# Patient Record
Sex: Male | Born: 1995 | Race: White | Hispanic: No | Marital: Single | Smoking: Current every day smoker
Health system: Southern US, Community
[De-identification: ages and names within clinical notes are randomized; demographics above are authoritative.]

## PROBLEM LIST (undated history)

## (undated) DIAGNOSIS — F419 Anxiety disorder, unspecified: Secondary | ICD-10-CM

## (undated) DIAGNOSIS — K219 Gastro-esophageal reflux disease without esophagitis: Secondary | ICD-10-CM

## (undated) DIAGNOSIS — F4310 Post-traumatic stress disorder, unspecified: Secondary | ICD-10-CM

## (undated) DIAGNOSIS — F32A Depression, unspecified: Secondary | ICD-10-CM

## (undated) HISTORY — DX: Gastro-esophageal reflux disease without esophagitis: K21.9

## (undated) HISTORY — DX: Anxiety disorder, unspecified: F41.9

## (undated) HISTORY — DX: Post-traumatic stress disorder, unspecified: F43.10

## (undated) HISTORY — DX: Depression, unspecified: F32.A

---

## 2016-08-07 ENCOUNTER — Emergency Department (HOSPITAL_COMMUNITY)
Admission: EM | Admit: 2016-08-07 | Discharge: 2016-08-07 | Disposition: A | Discharge status: Home / Self Care | Payer: Self-pay | Attending: Emergency Medicine | Admitting: Emergency Medicine

## 2016-08-07 ENCOUNTER — Encounter (HOSPITAL_COMMUNITY): Payer: Self-pay

## 2016-08-07 ENCOUNTER — Emergency Department (HOSPITAL_COMMUNITY): Payer: Self-pay

## 2016-08-07 DIAGNOSIS — R1084 Generalized abdominal pain: Secondary | ICD-10-CM | POA: Insufficient documentation

## 2016-08-07 DIAGNOSIS — Z79899 Other long term (current) drug therapy: Secondary | ICD-10-CM | POA: Insufficient documentation

## 2016-08-07 DIAGNOSIS — F17210 Nicotine dependence, cigarettes, uncomplicated: Secondary | ICD-10-CM | POA: Insufficient documentation

## 2016-08-07 DIAGNOSIS — F1290 Cannabis use, unspecified, uncomplicated: Secondary | ICD-10-CM | POA: Insufficient documentation

## 2016-08-07 DIAGNOSIS — R109 Unspecified abdominal pain: Secondary | ICD-10-CM

## 2016-08-07 LAB — COMPREHENSIVE METABOLIC PANEL
ALBUMIN: 4.4 g/dL (ref 3.5–5.0)
ALK PHOS: 91 U/L (ref 38–126)
ALT: 22 U/L (ref 17–63)
AST: 19 U/L (ref 15–41)
Anion gap: 5 (ref 5–15)
BILIRUBIN TOTAL: 0.8 mg/dL (ref 0.3–1.2)
BUN: 11 mg/dL (ref 6–20)
CALCIUM: 9.8 mg/dL (ref 8.9–10.3)
CO2: 31 mmol/L (ref 22–32)
Chloride: 102 mmol/L (ref 101–111)
Creatinine, Ser: 0.94 mg/dL (ref 0.61–1.24)
Glucose, Bld: 100 mg/dL — ABNORMAL HIGH (ref 65–99)
Potassium: 4.6 mmol/L (ref 3.5–5.1)
Sodium: 138 mmol/L (ref 135–145)
TOTAL PROTEIN: 7.4 g/dL (ref 6.5–8.1)

## 2016-08-07 LAB — LIPASE, BLOOD: Lipase: 10 U/L — ABNORMAL LOW (ref 11–51)

## 2016-08-07 LAB — CBC
HCT: 49 % (ref 39.0–52.0)
Hemoglobin: 16.9 g/dL (ref 13.0–17.0)
MCH: 31.3 pg (ref 26.0–34.0)
MCHC: 34.5 g/dL (ref 30.0–36.0)
MCV: 90.7 fL (ref 78.0–100.0)
Platelets: 249 10*3/uL (ref 150–400)
RBC: 5.4 MIL/uL (ref 4.22–5.81)
RDW: 13.1 % (ref 11.5–15.5)
WBC: 7.4 10*3/uL (ref 4.0–10.5)

## 2016-08-07 MED ORDER — IOPAMIDOL (ISOVUE-300) INJECTION 61%
100.0000 mL | Freq: Once | INTRAVENOUS | Status: AC | PRN
  Administered 2016-08-07: 100 mL via INTRAVENOUS

## 2016-08-07 MED ORDER — FAMOTIDINE IN NACL 20-0.9 MG/50ML-% IV SOLN
20.0000 mg | Freq: Once | INTRAVENOUS | Status: AC
  Administered 2016-08-07: 20 mg via INTRAVENOUS
  Filled 2016-08-07: qty 50

## 2016-08-07 MED ORDER — DICYCLOMINE HCL 20 MG PO TABS: 20.0000 mg | ORAL_TABLET | Freq: Four times a day (QID) | ORAL | 0 refills | Status: DC | PRN

## 2016-08-07 MED ORDER — FAMOTIDINE 20 MG PO TABS: 20.0000 mg | ORAL_TABLET | Freq: Two times a day (BID) | ORAL | 0 refills | Status: DC

## 2016-08-07 MED ORDER — IOPAMIDOL (ISOVUE-300) INJECTION 61%
INTRAVENOUS | Status: AC
  Administered 2016-08-07: 30 mL via ORAL
  Filled 2016-08-07: qty 30

## 2016-08-07 MED ORDER — DICYCLOMINE HCL 10 MG/ML IM SOLN
20.0000 mg | Freq: Once | INTRAMUSCULAR | Status: AC
  Administered 2016-08-07: 20 mg via INTRAMUSCULAR
  Filled 2016-08-07: qty 2

## 2016-08-07 NOTE — ED Triage Notes (Signed)
Reports of generalized abdominal pain x1 week. States it will get better then worsen as the day goes on.  Also reports of diarrhea. Denies nausea or vomiting, fevers.

## 2016-08-07 NOTE — ED Notes (Signed)
Patient transported to CT 

## 2016-08-07 NOTE — ED Notes (Signed)
Pt walked out of the room 2 times. This RN asked this pt if someone had removed his IV because he was dressed and had his jacket on. Pt stated, " I still have this port in." Pt still had his IV in and I asked him to step back in his room so we could remove his IV.

## 2016-08-07 NOTE — Discharge Instructions (Signed)
Eat a bland diet, avoiding greasy, fatty, fried foods, as well as spicy and acidic foods or beverages.  Avoid eating within the hour or 2 before going to bed or laying down.  Also avoid teas, colas, coffee, chocolate, pepermint and spearment. Take the prescriptions as directed.  Call your regular medical doctor tomorrow to schedule a follow up appointment this week.  Return to the Emergency Department immediately if worsening.

## 2016-08-07 NOTE — ED Provider Notes (Signed)
[facility] Provider Note   CSN: [id number] Arrival date & time: 08/07/16  1536     History   Chief Complaint Chief Complaint  Patient presents with  . Abdominal Pain    HPI Adrian Kirby is a 21 y.o. male.  HPI  Pt was seen at 1750.  Per pt, c/o gradual onset and persistence of constant generalized abd "pain" for the past 1 week.  Has been associated with no other symptoms. Denies N/V, no diarrhea, no fevers, no back pain, no rash, no CP/SOB, no black or blood in stools, no dysuria/hematuria, no testicular pain/swelling.      History reviewed. No pertinent past medical history.  There are no active problems to display for this patient.   History reviewed. No pertinent surgical history.    Home Medications    Prior to Admission medications   Medication Sig Start Date End Date Taking? Authorizing Provider  Cholecalciferol (VITAMIN D3 PO) Take 1 capsule by mouth daily.   Yes Historical Provider, MD  Multiple Vitamin (MULTIVITAMIN WITH MINERALS) TABS tablet Take 1 tablet by mouth daily.    Historical Provider, MD    Family History No family history on file.  Social History Social History  Substance Use Topics  . Smoking status: Current Every Day Smoker    Types: E-cigarettes  . Smokeless tobacco: Never Used  . Alcohol use No     Allergies   Patient has no known allergies.   Review of Systems Review of Systems ROS: Statement: All systems negative except as marked or noted in the HPI; Constitutional: Negative for fever and chills. ; ; Eyes: Negative for eye pain, redness and discharge. ; ; ENMT: Negative for ear pain, hoarseness, nasal congestion, sinus pressure and sore throat. ; ; Cardiovascular: Negative for chest pain, palpitations, diaphoresis, dyspnea and peripheral edema. ; ; Respiratory: Negative for cough, wheezing and stridor. ; ; Gastrointestinal: +abd pain. Negative for nausea, vomiting, diarrhea, blood in stool, hematemesis, jaundice and  rectal bleeding. . ; ; Genitourinary: Negative for dysuria, flank pain and hematuria. ; ; Musculoskeletal: Negative for back pain and neck pain. Negative for swelling and trauma.; ; Skin: Negative for pruritus, rash, abrasions, blisters, bruising and skin lesion.; ; Neuro: Negative for headache, lightheadedness and neck stiffness. Negative for weakness, altered level of consciousness, altered mental status, extremity weakness, paresthesias, involuntary movement, seizure and syncope.       Physical Exam Updated Vital Signs BP 134/78 (BP Location: Left Arm)   Pulse (!) 57   Temp 98.7 F (37.1 C) (Oral)   Resp 16   Ht 5\' 7"  (1.702 m)   Wt 123 lb (55.8 kg)   SpO2 100%   BMI 19.26 kg/m   Physical Exam 1755: Physical examination:  Nursing notes reviewed; Vital signs and O2 SAT reviewed;  Constitutional: Well developed, Well nourished, Well hydrated, In no acute distress; Head:  Normocephalic, atraumatic; Eyes: EOMI, PERRL, No scleral icterus; ENMT: Mouth and pharynx normal, Mucous membranes moist; Neck: Supple, Full range of motion, No lymphadenopathy; Cardiovascular: Regular rate and rhythm, No murmur, rub, or gallop; Respiratory: Breath sounds clear & equal bilaterally, No rales, rhonchi, wheezes.  Speaking full sentences with ease, Normal respiratory effort/excursion; Chest: Nontender, Movement normal; Abdomen: Soft, +diffuse tenderness to palp. No rebound or guarding. Nondistended, Normal bowel sounds; Genitourinary: No CVA tenderness; Extremities: Pulses normal, No tenderness, No edema, No calf edema or asymmetry.; Neuro: AA&Ox3, Major CN grossly intact.  Speech clear. No gross focal motor or sensory deficits in  extremities.; Skin: Color normal, Warm, Dry.    ED Treatments / Results  Labs (all labs ordered are listed, but only abnormal results are displayed)   EKG  EKG Interpretation None       Radiology   Procedures Procedures (including critical care time)  Medications  Ordered in ED Medications  iopamidol (ISOVUE-300) 61 % injection (not administered)  famotidine (PEPCID) IVPB 20 mg premix (20 mg Intravenous New Bag/Given 08/07/16 1818)  dicyclomine (BENTYL) injection 20 mg (20 mg Intramuscular Given 08/07/16 1818)     Initial Impression / Assessment and Plan / ED Course  I have reviewed the triage vital signs and the nursing notes.  Pertinent labs & imaging results that were available during my care of the patient were reviewed by me and considered in my medical decision making (see chart for details).  MDM Reviewed: nursing note and vitals Interpretation: labs and CT scan   Results for orders placed or performed during the hospital encounter of 08/07/16  Lipase, blood  Result Value Ref Range   Lipase 10 (L) 11 - 51 U/L  Comprehensive metabolic panel  Result Value Ref Range   Sodium 138 135 - 145 mmol/L   Potassium 4.6 3.5 - 5.1 mmol/L   Chloride 102 101 - 111 mmol/L   CO2 31 22 - 32 mmol/L   Glucose, Bld 100 (H) 65 - 99 mg/dL   BUN 11 6 - 20 mg/dL   Creatinine, Ser 1.61 0.61 - 1.24 mg/dL   Calcium 9.8 8.9 - 09.6 mg/dL   Total Protein 7.4 6.5 - 8.1 g/dL   Albumin 4.4 3.5 - 5.0 g/dL   AST 19 15 - 41 U/L   ALT 22 17 - 63 U/L   Alkaline Phosphatase 91 38 - 126 U/L   Total Bilirubin 0.8 0.3 - 1.2 mg/dL   GFR calc non Af Amer >60 >60 mL/min   GFR calc Af Amer >60 >60 mL/min   Anion gap 5 5 - 15  CBC  Result Value Ref Range   WBC 7.4 4.0 - 10.5 K/uL   RBC 5.40 4.22 - 5.81 MIL/uL   Hemoglobin 16.9 13.0 - 17.0 g/dL   HCT 04.5 40.9 - 81.1 %   MCV 90.7 78.0 - 100.0 fL   MCH 31.3 26.0 - 34.0 pg   MCHC 34.5 30.0 - 36.0 g/dL   RDW 91.4 78.2 - 95.6 %   Platelets 249 150 - 400 K/uL   Ct Abdomen Pelvis W Contrast Result Date: 08/07/2016 CLINICAL DATA:  Patient with mid and lower abdominal pain for 6 days. Diarrhea. EXAM: CT ABDOMEN AND PELVIS WITH CONTRAST TECHNIQUE: Multidetector CT imaging of the abdomen and pelvis was performed using the  standard protocol following bolus administration of intravenous contrast. CONTRAST:  30mL ISOVUE-300 IOPAMIDOL (ISOVUE-300) INJECTION 61%, ISOVUE-300 IOPAMIDOL (ISOVUE-300) INJECTION 61% COMPARISON:  None. FINDINGS: Lower chest: Normal heart size. No consolidative pulmonary opacities. No pleural effusion. Hepatobiliary: Liver is normal in size and contour. No focal hepatic lesion is identified. Gallbladder is unremarkable. Pancreas: Unremarkable Spleen: Unremarkable Adrenals/Urinary Tract: The adrenal glands are normal. Kidneys enhance symmetrically with contrast. No hydronephrosis. Stomach/Bowel: Oral contrast material throughout the majority of the small large bowel. No evidence for bowel obstruction. No free fluid or free intraperitoneal air. Normal morphology of the stomach. Vascular/Lymphatic: Normal caliber abdominal aorta. No retroperitoneal lymphadenopathy. Reproductive: Prostate unremarkable. Other: None. Musculoskeletal: No aggressive or acute appearing osseous lesions. IMPRESSION: No acute process within the abdomen or pelvis. Electronically Signed  By: Annia Beltrew  Davis M.D.   On: 08/07/2016 20:48    2145:  Pt has tol PO well while in the ED without N/V.  No stooling while in the ED.  Abd benign, VSS. Feels better and wants to go home now. Tx symptomatically at this time. Dx and testing d/w pt.  Questions answered.  Verb understanding, agreeable to d/c home with outpt f/u.    Final Clinical Impressions(s) / ED Diagnoses   Final diagnoses:  None    New Prescriptions New Prescriptions   No medications on file     Samuel JesterKathleen Khaleed Holan, DO 08/10/16 1629

## 2016-11-04 ENCOUNTER — Emergency Department (HOSPITAL_COMMUNITY)
Admission: EM | Admit: 2016-11-04 | Discharge: 2016-11-05 | Disposition: A | Discharge status: Other Acute Inpatient Hospital | Payer: Self-pay | Attending: Emergency Medicine | Admitting: Emergency Medicine

## 2016-11-04 ENCOUNTER — Encounter (HOSPITAL_COMMUNITY): Payer: Self-pay | Admitting: Emergency Medicine

## 2016-11-04 DIAGNOSIS — F17220 Nicotine dependence, chewing tobacco, uncomplicated: Secondary | ICD-10-CM | POA: Insufficient documentation

## 2016-11-04 DIAGNOSIS — F332 Major depressive disorder, recurrent severe without psychotic features: Secondary | ICD-10-CM | POA: Insufficient documentation

## 2016-11-04 DIAGNOSIS — R45851 Suicidal ideations: Secondary | ICD-10-CM

## 2016-11-04 LAB — COMPREHENSIVE METABOLIC PANEL
ALK PHOS: 78 U/L (ref 38–126)
ALT: 13 U/L — AB (ref 17–63)
AST: 20 U/L (ref 15–41)
Albumin: 4.6 g/dL (ref 3.5–5.0)
Anion gap: 6 (ref 5–15)
BUN: 8 mg/dL (ref 6–20)
CALCIUM: 9.5 mg/dL (ref 8.9–10.3)
CO2: 28 mmol/L (ref 22–32)
Chloride: 104 mmol/L (ref 101–111)
Creatinine, Ser: 0.87 mg/dL (ref 0.61–1.24)
GFR calc Af Amer: >60 mL/min (ref >60)
GFR calc non Af Amer: >60 mL/min (ref >60)
Glucose, Bld: 81 mg/dL (ref 65–99)
Potassium: 3.8 mmol/L (ref 3.5–5.1)
SODIUM: 138 mmol/L (ref 135–145)
Total Bilirubin: 0.6 mg/dL (ref 0.3–1.2)
Total Protein: 7.5 g/dL (ref 6.5–8.1)

## 2016-11-04 LAB — RAPID URINE DRUG SCREEN, HOSP PERFORMED
Amphetamines: NOT DETECTED
Barbiturates: NOT DETECTED
Benzodiazepines: NOT DETECTED
Cocaine: NOT DETECTED
OPIATES: NOT DETECTED
TETRAHYDROCANNABINOL: POSITIVE — AB

## 2016-11-04 LAB — CBC WITH DIFFERENTIAL/PLATELET
Basophils Absolute: 0.1 10*3/uL (ref 0.0–0.1)
Basophils Relative: 1 %
EOS ABS: 0.4 10*3/uL (ref 0.0–0.7)
Eosinophils Relative: 4 %
HCT: 47 % (ref 39.0–52.0)
HEMOGLOBIN: 16.6 g/dL (ref 13.0–17.0)
LYMPHS ABS: 2.1 10*3/uL (ref 0.7–4.0)
Lymphocytes Relative: 23 %
MCH: 31.9 pg (ref 26.0–34.0)
MCHC: 35.3 g/dL (ref 30.0–36.0)
MCV: 90.4 fL (ref 78.0–100.0)
MONO ABS: 0.8 10*3/uL (ref 0.1–1.0)
MONOS PCT: 9 %
NEUTROS PCT: 63 %
Neutro Abs: 5.9 10*3/uL (ref 1.7–7.7)
Platelets: 217 10*3/uL (ref 150–400)
RBC: 5.2 MIL/uL (ref 4.22–5.81)
RDW: 12.5 % (ref 11.5–15.5)
WBC: 9.2 10*3/uL (ref 4.0–10.5)

## 2016-11-04 LAB — SALICYLATE LEVEL

## 2016-11-04 LAB — ETHANOL: Alcohol, Ethyl (B): <5 mg/dL (ref <5)

## 2016-11-04 LAB — ACETAMINOPHEN LEVEL: Acetaminophen (Tylenol), Serum: <10 ug/mL — ABNORMAL LOW (ref 10–30)

## 2016-11-04 MED ORDER — IBUPROFEN 400 MG PO TABS: 600.0000 mg | ORAL_TABLET | Freq: Three times a day (TID) | ORAL | Status: DC | PRN

## 2016-11-04 MED ORDER — ACETAMINOPHEN 325 MG PO TABS: 650.0000 mg | ORAL_TABLET | ORAL | Status: DC | PRN

## 2016-11-04 NOTE — BH Assessment (Signed)
Assessment completed. Consulted Nira ConnJason Berry, FNP who recommended inpatient treatment. Informed Dr. Verdie MosherLiu of the recommendation.

## 2016-11-04 NOTE — ED Notes (Signed)
Patient states he "recently lost his best friend" he states he is "in love with her" and she "blocked him out of her life" patient states he "went to [facility] because he had thoughts of wanting to kill himself" and he got "scared" patient admits to history of suicide attempt by overdose years ago, and states he "tries to block those thoughts out of his mind" patient states the sadness and negative thoughts prevents him from going to work and taking care of himself and that makes him feel bad too. During conversation patient is tearful, but calm and cooperative.

## 2016-11-04 NOTE — ED Notes (Signed)
Pt changed into scrubs & belongings locked up

## 2016-11-04 NOTE — ED Triage Notes (Signed)
[facility] and was told to come here to be seen.  Pt admits to having suicidal thoughts.  Pt extremely vague when asked questions and unable to get any other information from him.

## 2016-11-04 NOTE — ED Provider Notes (Signed)
[facility] Provider Note   CSN: [id number] Arrival date & time: 11/04/16  1840     History   Chief Complaint Chief Complaint  Patient presents with  . V70.1    HPI Adrian Kirby is a 21 y.o. male.  HPI [facility]. States he has been having suicidal thoughts and anxiety. States he does have a history of anxiety depression. States he did have previous suicide attempt. States the right inpatient psychiatric treatment. Has seen a counselor but it did not work. Previous substance abuse history. States he was taking cough medicine when he was ages [age]. Occasion will see things that aren't there or is the wrong thing. States he looked over his nephew one time and saw a dog looked away came back and was back to being his nephew. States he has had suicidal thoughts but is not a planned. Occasionally will smoke some marijuana. No headaches. No confusion. He states he went over a [facility] to get help because his best friend left him.  History reviewed. No pertinent past medical history.  There are no active problems to display for this patient.   History reviewed. No pertinent surgical history.     Home Medications    Prior to Admission medications   Medication Sig Start Date End Date Taking? Authorizing Provider  Cholecalciferol (VITAMIN D3 PO) Take 1 capsule by mouth daily.    [provider]  dicyclomine (BENTYL) 20 MG tablet Take 1 tablet (20 mg total) by mouth every 6 (six) hours as needed for spasms (abdominal cramping). 08/07/16   Samuel JesterMcManus, Kathleen, DO  famotidine (PEPCID) 20 MG tablet Take 1 tablet (20 mg total) by mouth 2 (two) times daily. 08/07/16   Samuel JesterMcManus, Kathleen, DO  Multiple Vitamin (MULTIVITAMIN WITH MINERALS) TABS tablet Take 1 tablet by mouth daily.    [provider]    Family History History reviewed. No pertinent family history.  Social History Social History  Substance Use Topics   . Smoking status: Current Every Day Smoker    Packs/day: 1.50  . Smokeless tobacco: Current User    Types: Chew  . Alcohol use Yes     Comment: "sometimes"     Allergies   Patient has no known allergies.   Review of Systems Review of Systems  Constitutional: Negative for appetite change and fever.  Respiratory: Negative for chest tightness.   Cardiovascular: Negative for chest pain.  Gastrointestinal: Negative for abdominal distention.  Genitourinary: Negative for flank pain.  Musculoskeletal: Negative for back pain.  Skin: Negative for pallor.  Neurological: Negative for headaches.  Hematological: Negative for adenopathy.  Psychiatric/Behavioral: Positive for dysphoric mood, hallucinations and suicidal ideas.     Physical Exam Updated Vital Signs BP 126/61 (BP Location: Left Arm)   Pulse 63   Temp 98.2 F (36.8 C) (Oral)   Resp 18   Ht 5\' 7"  (1.702 m)   Wt 125 lb (56.7 kg)   SpO2 98%   BMI 19.58 kg/m   Physical Exam  Constitutional: He appears well-developed.  HENT:  Head: Normocephalic.  Neck: Neck supple.  Cardiovascular: Normal rate.   Pulmonary/Chest: Effort normal.  Abdominal: Soft.  Musculoskeletal: Normal range of motion.  Neurological: He is alert.  Skin: Skin is warm.  Psychiatric:  Somewhat flat affect but appropriate.     ED Treatments / Results  Labs (all labs ordered are listed, but only abnormal results are displayed) Labs Reviewed  RAPID URINE DRUG  SCREEN, HOSP PERFORMED - Abnormal; Notable for the following:       Result Value   Tetrahydrocannabinol POSITIVE (*)    All other components within normal limits  CBC WITH DIFFERENTIAL/PLATELET  COMPREHENSIVE METABOLIC PANEL  ETHANOL  ACETAMINOPHEN LEVEL  SALICYLATE LEVEL    EKG  EKG Interpretation None       Radiology No results found.  Procedures Procedures (including critical care time)  Medications Ordered in ED Medications - No data to display   Initial  Impression / Assessment and Plan / ED Course  I have reviewed the triage vital signs and the nursing notes.  Pertinent labs & imaging results that were available during my care of the patient were reviewed by me and considered in my medical decision making (see chart for details).     Patient with depression. Has had anxiety for a long time. Has thought of wanting to kill himself. Did have previous attempt. Does not have a current plan but states he is always not to do it. Denies substance abuse. Lab work pending but likely will be medically cleared. After that to be seen by TTS. At this point I do not know if he has criteria for involuntary commitment but does appear to benefit from further treatment. Care turned over to Dr Verdie Mosher.  Final Clinical Impressions(s) / ED Diagnoses   Final diagnoses:  Passive suicidal ideations    New Prescriptions New Prescriptions   No medications on file     Benjiman Core, MD 11/04/16 [phone]

## 2016-11-04 NOTE — ED Notes (Signed)
Patient changed into paper scrubs at this time. Blood drawn by lab, and  Urine collected. Security contacted to wand patient at this time.

## 2016-11-04 NOTE — ED Notes (Signed)
Pt sitting on the side of bed rocking

## 2016-11-04 NOTE — ED Provider Notes (Signed)
Please see previous physicians note regarding patient's presenting history and physical, initial ED course, and associated medical decision making.   Blood work unremarkable. Medically cleared for TTS consult.   Discussed with [facility]. Recommending inpatient treatment. Placement pending.   Lavera GuiseLiu, Anjelita Sheahan Duo, MD 11/04/16 [phone]

## 2016-11-04 NOTE — BH Assessment (Addendum)
Tele Assessment Note   Adrian Kirby is an 21 y.o. male presenting to [facility] reporting suicidal ideation. Pt stated "I have trouble trying to cope with things that's happening". "I have chronic depression and anxiety". "I can't focus on taking care of myself or anything". PT is endorsing suicidal ideation; however pt denies having a plan or intent. Pt reported that he attempted suicide when he was a teenager. Pt also shared that he has a history of cutting but denies any recent cuts in the past year. Pt denies HI and AVH at this time. Pt is endorsing multiple depressive symptoms and shared that his sleep and appetite are fair. No significant weight changes reported. Pt denies having access to weapons or firearms. Pt reported that he drinks alcohol and smokes marijuana several times a week. No psychiatric  hospitalizations reported. No current mental health treatment reported. No trauma history report and pt denies physical, sexual and emotional abuse.   Diagnosis: Major Depressive Disorder, Recurrent, Severe; Alcohol Use Disorder, Severe, Cannabis Use Disorder   Past Medical History: History reviewed. No pertinent past medical history.  History reviewed. No pertinent surgical history.  Family History: History reviewed. No pertinent family history.  Social History:  reports that he has been smoking.  He has been smoking about 1.50 packs per day. His smokeless tobacco use includes Chew. He reports that he drinks alcohol. He reports that he uses drugs, including Marijuana.  Additional Social History:  Alcohol / Drug Use History of alcohol / drug use?: Yes Longest period of sobriety (when/how long): 1.5 years  Negative Consequences of Use: Personal relationships Substance #1 Name of Substance 1: Alcohol 1 - Age of First Use: 15 1 - Amount (size/oz): 1 pint  1 - Frequency: few times per week  1 - Duration: ongoing  1 - Last Use / Amount: 10-29-16 Substance #2 Name of Substance 2: Marijuana  2 -  Age of First Use: 12 2 - Amount (size/oz): 2 grams  2 - Frequency: every few days  2 - Duration: ongoing 2 - Last Use / Amount: 11-04-16 UDS-positive.   CIWA: CIWA-Ar BP: 126/61 Pulse Rate: 63 COWS:    PATIENT STRENGTHS: (choose at least two) Average or above average intelligence Motivation for treatment/growth  Allergies: No Known Allergies  Home Medications:  (Not in a hospital admission)  OB/GYN Status:  No LMP for male patient.  General Assessment Data Location of Assessment: [facility] TTS Assessment: In system Is this a Tele or Face-to-Face Assessment?: Tele Assessment Is this an Initial Assessment or a Re-assessment for this encounter?: Initial Assessment Marital status: Single Living Arrangements: Other (Comment) (Friend ) Can pt return to current living arrangement?: Yes Admission Status: Voluntary Is patient capable of signing voluntary admission?: Yes Referral Source: Self/Family/Friend Insurance type: None      Crisis Care Plan Living Arrangements: Other (Comment) (Friend ) Name of Psychiatrist: No provider reported.  Name of Therapist: No provider reported.   Education Status Is patient currently in school?: No Highest grade of school patient has completed: 12  Risk to self with the past 6 months Suicidal Ideation: Yes-Currently Present Has patient been a risk to self within the past 6 months prior to admission? : No Suicidal Intent: No Has patient had any suicidal intent within the past 6 months prior to admission? : No Is patient at risk for suicide?: No Suicidal Plan?: No Has patient had any suicidal plan within the past 6 months prior to admission? : No Access to Means:  No What has been your use of drugs/alcohol within the last 12 months?: Marijuana use reported.  Previous Attempts/Gestures: Yes How many times?: 1 Other Self Harm Risks: History of cutting.  Triggers for Past Attempts: Unpredictable Intentional Self Injurious Behavior:  Cutting Comment - Self Injurious Behavior: Pt reported a history of cutting  Family Suicide History: No Recent stressful life event(s): Financial Problems, Conflict (Comment) (conflict with best friend. ) Persecutory voices/beliefs?: No Depression: Yes Depression Symptoms: Despondent, Insomnia, Tearfulness, Isolating, Fatigue, Guilt, Loss of interest in usual pleasures, Feeling worthless/self pity, Feeling angry/irritable Substance abuse history and/or treatment for substance abuse?: Yes  Risk to Others within the past 6 months Homicidal Ideation: No Does patient have any lifetime risk of violence toward others beyond the six months prior to admission? : No Thoughts of Harm to Others: No Current Homicidal Intent: No Current Homicidal Plan: No Access to Homicidal Means: No Identified Victim: N/A History of harm to others?: No Assessment of Violence: None Noted Violent Behavior Description: No violent behaviors observed.  Does patient have access to weapons?: No Criminal Charges Pending?: No Does patient have a court date: No Is patient on probation?: No  Psychosis Hallucinations: None noted Delusions: None noted  Mental Status Report Appearance/Hygiene: In scrubs Eye Contact: Fair Motor Activity: Unremarkable Speech: Logical/coherent Level of Consciousness: Quiet/awake Mood: Depressed Affect: Appropriate to circumstance Anxiety Level: Minimal Thought Processes: Coherent, Relevant Judgement: Unimpaired Orientation: Time, Situation, Place, Person Obsessive Compulsive Thoughts/Behaviors: None  Cognitive Functioning Concentration: Decreased Memory: Recent Intact, Remote Intact IQ: Average Insight: Fair Impulse Control: Fair Appetite: Fair Weight Loss: 0 Weight Gain: 0 Sleep: Decreased Total Hours of Sleep: 6 Vegetative Symptoms: Staying in bed, Decreased grooming  ADLScreening [facility] Assessment Services) Patient's cognitive ability adequate to safely complete daily  activities?: Yes Patient able to express need for assistance with ADLs?: Yes Independently performs ADLs?: Yes (appropriate for developmental age)  Prior Inpatient Therapy Prior Inpatient Therapy: No  Prior Outpatient Therapy Prior Outpatient Therapy: Yes Prior Therapy Facilty/Provider(s): [facility] ([place], [place])  Reason for Treatment: anxiety  Does patient have an ACCT team?: No Does patient have Intensive In-House Services?  : No Does patient have [facility] services? : No Does patient have P4CC services?: No  ADL Screening (condition at time of admission) Patient's cognitive ability adequate to safely complete daily activities?: Yes Is the patient deaf or have difficulty hearing?: No Does the patient have difficulty seeing, even when wearing glasses/contacts?: No Does the patient have difficulty concentrating, remembering, or making decisions?: No Patient able to express need for assistance with ADLs?: Yes Does the patient have difficulty dressing or bathing?: No Independently performs ADLs?: Yes (appropriate for developmental age) Does the patient have difficulty walking or climbing stairs?: No       Abuse/Neglect Assessment (Assessment to be complete while patient is alone) Physical Abuse: Denies Verbal Abuse: Yes, past (Comment) Sexual Abuse: Denies Exploitation of patient/patient's resources: Denies Self-Neglect: Denies     Merchant navy officer (For Healthcare) Does Patient Have a Medical Advance Directive?: No    Additional Information 1:1 In Past 12 Months?: No CIRT Risk: No Elopement Risk: No Does patient have medical clearance?: Yes     Disposition:  Disposition Initial Assessment Completed for this Encounter: Yes Disposition of Patient: Inpatient treatment program Type of inpatient treatment program: Adult  Frazier Balfour S 11/04/2016 11:37 PM

## 2016-11-05 ENCOUNTER — Inpatient Hospital Stay (HOSPITAL_COMMUNITY)
Admission: AD | Admit: 2016-11-05 | Discharge: 2016-11-09 | DRG: 881 | Disposition: A | Discharge status: Home / Self Care | Payer: No Typology Code available for payment source | Source: Intra-hospital | Attending: Psychiatry | Admitting: Psychiatry

## 2016-11-05 ENCOUNTER — Encounter (HOSPITAL_COMMUNITY): Payer: Self-pay

## 2016-11-05 DIAGNOSIS — F17200 Nicotine dependence, unspecified, uncomplicated: Secondary | ICD-10-CM | POA: Diagnosis present

## 2016-11-05 DIAGNOSIS — F1210 Cannabis abuse, uncomplicated: Secondary | ICD-10-CM | POA: Diagnosis present

## 2016-11-05 DIAGNOSIS — G4700 Insomnia, unspecified: Secondary | ICD-10-CM | POA: Diagnosis not present

## 2016-11-05 DIAGNOSIS — F419 Anxiety disorder, unspecified: Secondary | ICD-10-CM | POA: Diagnosis present

## 2016-11-05 DIAGNOSIS — F332 Major depressive disorder, recurrent severe without psychotic features: Secondary | ICD-10-CM

## 2016-11-05 DIAGNOSIS — F329 Major depressive disorder, single episode, unspecified: Secondary | ICD-10-CM | POA: Diagnosis present

## 2016-11-05 DIAGNOSIS — F17210 Nicotine dependence, cigarettes, uncomplicated: Secondary | ICD-10-CM | POA: Diagnosis not present

## 2016-11-05 DIAGNOSIS — Z915 Personal history of self-harm: Secondary | ICD-10-CM | POA: Diagnosis not present

## 2016-11-05 DIAGNOSIS — R45851 Suicidal ideations: Secondary | ICD-10-CM | POA: Diagnosis present

## 2016-11-05 DIAGNOSIS — F1994 Other psychoactive substance use, unspecified with psychoactive substance-induced mood disorder: Secondary | ICD-10-CM | POA: Diagnosis present

## 2016-11-05 DIAGNOSIS — Z79899 Other long term (current) drug therapy: Secondary | ICD-10-CM | POA: Diagnosis not present

## 2016-11-05 DIAGNOSIS — F1290 Cannabis use, unspecified, uncomplicated: Secondary | ICD-10-CM | POA: Diagnosis not present

## 2016-11-05 MED ORDER — ESCITALOPRAM OXALATE 5 MG PO TABS
5.0000 mg | ORAL_TABLET | Freq: Every day | ORAL | Status: DC
  Administered 2016-11-05: 5 mg via ORAL
  Filled 2016-11-05 (×4): qty 1
  Filled 2016-11-05: qty 7
  Filled 2016-11-05 (×2): qty 1

## 2016-11-05 MED ORDER — MAGNESIUM HYDROXIDE 400 MG/5ML PO SUSP: 30.0000 mL | Freq: Every day | ORAL | Status: DC | PRN

## 2016-11-05 MED ORDER — HYDROXYZINE HCL 25 MG PO TABS
25.0000 mg | ORAL_TABLET | Freq: Three times a day (TID) | ORAL | Status: DC | PRN
  Administered 2016-11-06: 25 mg via ORAL
  Filled 2016-11-05 (×2): qty 1
  Filled 2016-11-05 (×2): qty 10

## 2016-11-05 MED ORDER — ALUM & MAG HYDROXIDE-SIMETH 200-200-20 MG/5ML PO SUSP: 30.0000 mL | ORAL | Status: DC | PRN

## 2016-11-05 MED ORDER — NICOTINE 21 MG/24HR TD PT24
21.0000 mg | MEDICATED_PATCH | Freq: Every day | TRANSDERMAL | Status: DC
  Administered 2016-11-05 – 2016-11-09 (×5): 21 mg via TRANSDERMAL
  Filled 2016-11-05 (×8): qty 1

## 2016-11-05 MED ORDER — PNEUMOCOCCAL VAC POLYVALENT 25 MCG/0.5ML IJ INJ: 0.5000 mL | INJECTION | INTRAMUSCULAR | Status: DC

## 2016-11-05 MED ORDER — QUETIAPINE FUMARATE 25 MG PO TABS
25.0000 mg | ORAL_TABLET | Freq: Every day | ORAL | Status: DC
  Filled 2016-11-05: qty 1

## 2016-11-05 MED ORDER — NICOTINE 14 MG/24HR TD PT24
14.0000 mg | MEDICATED_PATCH | Freq: Every day | TRANSDERMAL | Status: DC
  Administered 2016-11-05: 14 mg via TRANSDERMAL
  Filled 2016-11-05: qty 1

## 2016-11-05 MED ORDER — LORAZEPAM 1 MG PO TABS
1.0000 mg | ORAL_TABLET | Freq: Three times a day (TID) | ORAL | Status: DC | PRN
  Administered 2016-11-08: 1 mg via ORAL
  Filled 2016-11-05 (×2): qty 1

## 2016-11-05 MED ORDER — ACETAMINOPHEN 325 MG PO TABS
650.0000 mg | ORAL_TABLET | Freq: Four times a day (QID) | ORAL | Status: DC | PRN
Start: 2016-11-05 — End: 2016-11-09

## 2016-11-05 MED ORDER — TRAZODONE HCL 50 MG PO TABS
50.0000 mg | ORAL_TABLET | Freq: Every evening | ORAL | Status: DC | PRN
  Administered 2016-11-05 – 2016-11-08 (×3): 50 mg via ORAL
  Filled 2016-11-05: qty 7
  Filled 2016-11-05 (×3): qty 1

## 2016-11-05 MED ORDER — LORAZEPAM 1 MG PO TABS
1.0000 mg | ORAL_TABLET | ORAL | Status: DC | PRN
  Filled 2016-11-05: qty 1

## 2016-11-05 NOTE — ED Notes (Signed)
[facility] here with this pt because of loud outburst and cursing. Pt calmer at this time.

## 2016-11-05 NOTE — ED Provider Notes (Signed)
BP 114/65 (BP Location: Right Arm)   Pulse (!) 54   Temp 98.2 F (36.8 C) (Oral)   Resp 15   Ht 5\' 7"  (1.702 m)   Wt 125 lb (56.7 kg)   SpO2 100%   BMI 19.58 kg/m  The patient appears reasonably stabilized for transfer considering the current resources, flow, and capabilities available in the ED at this time, and I doubt any other [facility] requiring further screening and/or treatment in the ED prior to transfer.    Zadie RhineWickline, Johnney Scarlata, MD 11/05/16 [phone]

## 2016-11-05 NOTE — Progress Notes (Signed)
Adrian Kirby is a 21 y.o. male voluntarily admitted for SI with no plan from [facility].  Reports chronic SI with no intent, history of multiple attempts "I wouldn't do it now I made a promise to my friend."  Reports drinking socially and smoking marijuana several times per week. Says he used to use psychadelic drugs when he was younger.  Denies every being hospitalized for psychiatric disorders..  Basic search of patient completed with skin check completed, no abnormal findings except a small abrasion on his right shoulder near his back "from work, I work in an [employer]".  Belongings reviewed and noted on belongings record.  Oriented to unit and rules, consents and treatment agreement reviewed with patient and signed.  No Known Allergies No past medical history on file. No past surgical history on file.

## 2016-11-05 NOTE — ED Provider Notes (Signed)
Pt becoming agitated, yelling and pacing around the room He is demanding his phone Due to his behavior, flight risk and h/o suicide attempt I have initiated IVC [facility]  To dr Jama Flavorscobos   Adrian Kirby, Adrian Simien, MD 11/05/16 [phone]

## 2016-11-05 NOTE — Tx Team (Signed)
Initial Treatment Plan 11/05/2016 9:51 AM Adrian DamesMichael Gibeau [id number]    PATIENT STRESSORS: Loss of friend Substance abuse   PATIENT STRENGTHS: Ability for insight Average or above average intelligence Motivation for treatment/growth Supportive family/friends   PATIENT IDENTIFIED PROBLEMS: "self destructive behaviors"  "intrusive thoughts"                   DISCHARGE CRITERIA:  Improved stabilization in mood, thinking, and/or behavior Need for constant or close observation no longer present Safe-care adequate arrangements made Verbal commitment to aftercare and medication compliance  PRELIMINARY DISCHARGE PLAN: Outpatient therapy  PATIENT/FAMILY INVOLVEMENT: This treatment plan has been presented to and reviewed with the patient, Adrian Kirby.  The patient and family have been given the opportunity to ask questions and make suggestions.  Lindajo Royalaniel P Drue Harr, RN 11/05/2016, 9:51 AM

## 2016-11-05 NOTE — ED Notes (Signed)
[facility] has been accepted to [facility] °

## 2016-11-05 NOTE — ED Notes (Signed)
Pt loud and cursing in his room. [employer] and security at the bedside at this time. [employer] called to sit with the pt.

## 2016-11-05 NOTE — Progress Notes (Signed)
D.  Pt pleasant but brief on approach, denies complaints at this time.  Pt states he does not take anything for sleep at night, states he normally sleeps okay.  Pt did attend evening AA group, minimal but appropriate interaction noted on unit.  Pt denies SI/HI/hallucinations at this time.  A.  Support and encouragement offered, medication given as ordered.  R. Pt remains safe on the unit, will continue to monitor.

## 2016-11-05 NOTE — [facility] Group Notes (Signed)
[facility] LCSW Group Therapy Note  11/05/2016 at 10:15 AM  Type of Therapy and Topic:  Group Therapy: Avoiding Self-Sabotaging and Enabling Behaviors  Participation Level:  Did Not Attend; invited to participate yet did not despite overhead announcement and encouragement by staff    Carney Bernatherine C Harrill, LCSW

## 2016-11-05 NOTE — [facility] Suicide Risk Assessment (Addendum)
[facility] Admission Suicide Risk Assessment   Nursing information obtained from:   patient and chart  Demographic factors:   21 year old single male , no children, lives with roommate, employed  Current Mental Status:   see below Loss Factors:    recent failed relationship  Historical Factors:   Anxiety, depression, reports history of angry outbursts.  Risk Reduction Factors:   resilience, physical health.  Total Time spent with patient: 45 minutes Principal Problem: MDD (major depressive disorder) Diagnosis:   Patient Active Problem List   Diagnosis Date Noted  . MDD (major depressive disorder) [F32.9] 11/05/2016    Continued Clinical Symptoms:  Alcohol Use Disorder Identification Test Final Score (AUDIT): 2 The "Alcohol Use Disorders Identification Test", Guidelines for Use in Primary Care, Second Edition.  World Science writer [facility]). Score between 0-7:  no or low risk or alcohol related problems. Score between 8-15:  moderate risk of alcohol related problems. Score between 16-19:  high risk of alcohol related problems. Score 20 or above:  warrants further diagnostic evaluation for alcohol dependence and treatment.   CLINICAL FACTORS:  1 year old single male. Lives with a roommate, he is employed, denies any legal issues.   Presents as a fair historian.  As per chart notes, patient went to [facility] for outpatient management, reported suicidal ideations. Patient denies suicidal ideations at this time, and states " I do not want to die".  Rather, he  emphasizes anxiety as his major symptom, and describes both a sense of free  Floating  anxiety, dread, social anxiety, and worrying excessively. He does endorse some depression, describes neuro-vegetative symptoms, such as poor appetite, erratic sleep, anhedonia, decreased sense of self esteem.  Patient reports having had a failed relationship , describing having a friend he was in love with going back to her prior boyfriend. Of note,  this event occurred several years ago, but states " I have not been able to get over it". Patient was not taking any medications prior to admission .  No prior psychiatric admissions, one prior suicide attempt 5 years ago, by overdosing , following a failed relationship , reports remote history of self cutting about 5 years ago, denies history of psychosis, reports history of chronic anxiety, denies agoraphobia, describes occasional panic attacks. Patient states he was on an antidepressant years ago, but took it for short time, does not remember name. Has not been on any psychiatric medications for years.   Denies drug or alcohol abuse , but does endorse intermittent cannabis use, and states he has been using regularly recently .  Denies medical illnesses, NKDA . Smokes 1 PPD   Dx- MDD, GAD, Consider Social Phobia,  Cannabis Abuse   Plan- inpatient admission Start Lexapro 5 mgrs QDAY, Ativan 1  mgrs Q 8 hours PRN for severe anxiety , Trazodone 50 mgrs QHS PRN for insomnia, Check TSH       Musculoskeletal: Strength & Muscle Tone: within normal limits Gait & Station: normal Patient leans: N/A  Psychiatric Specialty Exam: Physical Exam  ROS no headache, no chest pain, no shortness of breath, no vomiting   Blood pressure 130/69, pulse (!) 59, temperature 97.6 F (36.4 C), temperature source Oral, resp. rate 16, height 5\' 7"  (1.702 m), weight 55.3 kg (122 lb), SpO2 100 %.Body mass index is 19.11 kg/m.  General Appearance: Fairly Groomed  Eye Contact:  Fair  Speech:  Normal Rate  Volume:  Normal  Mood:  depressed, anxious   Affect:  presents depressed,  anxious   Thought Process:  Linear and Descriptions of Associations: Intact- becomes tangential with open ended questions   Orientation:  Other:  fully alert and attentive - oriented x 3.   Thought Content:  denies hallucinations, no delusions   Suicidal Thoughts:  No, denies active suicidal or self injurious ideations,states " I do  not want to die".  Homicidal Thoughts:  No denies any homicidal or violent ideations  Memory:  recent and remote grossly intact   Judgement:  Fair  Insight:  Fair  Psychomotor Activity:  slight restlessness, such as foot tapping , fidgeting  Concentration:  Concentration: Good and Attention Span: Good  Recall:  Good  Fund of Knowledge:  Good  Language:  Good  Akathisia:  Negative  Handed:  Right  AIMS (if indicated):     Assets:  Desire for Improvement Physical Health Resilience  ADL's:  Intact  Cognition:  WNL  Sleep:         COGNITIVE FEATURES THAT CONTRIBUTE TO RISK:  Closed-mindedness and Loss of executive function    SUICIDE RISK:   Moderate:  Frequent suicidal ideation with limited intensity, and duration, some specificity in terms of plans, no associated intent, good self-control, limited dysphoria/symptomatology, some risk factors present, and identifiable protective factors, including available and accessible social support.  PLAN OF CARE: Patient will be admitted to inpatient psychiatric unit for stabilization and safety. Will provide and encourage milieu participation. Provide medication management and maked adjustments as needed.  Will follow daily.    I certify that inpatient services furnished can reasonably be expected to improve the patient's condition.   Craige CottaFernando A Korissa Horsford, MD 11/05/2016, 4:48 PM

## 2016-11-05 NOTE — BH Assessment (Signed)
[facility]- Beds 2 to the services of Dr. Jama Flavorsobos. [facility]. Informed Dr. Bebe ShaggyWickline of pt's acceptance.

## 2016-11-05 NOTE — ED Notes (Signed)
Pt sitting on the bed shaking and jerking with his hands and legs. Pt keeps stating that "I can not sit in this room without my phone" I want to see my dog" "my friend is out in the waiting room, I think"

## 2016-11-05 NOTE — H&P (Signed)
Psychiatric Admission Assessment Adult  Patient Identification: Adrian Kirby MRN:  [id number] Date of Evaluation:  11/05/2016 Chief Complaint:  mdd,rec,sev etoh use disorder cannabis use disorder Principal Diagnosis: MDD (major depressive disorder) Diagnosis:   Patient Active Problem List   Diagnosis Date Noted  . MDD (major depressive disorder) [F32.9] 11/05/2016   History of Present Illness: Per tele-assessment-Adrian Kirby is an 21 y.o. male presenting to [facility] reporting suicidal ideation. Pt stated "I have trouble trying to cope with things that's happening". "I have chronic depression and anxiety". "I can't focus on taking care of myself or anything". PT is endorsing suicidal ideation; however pt denies having a plan or intent. Pt reported that he attempted suicide when he was a teenager. Pt also shared that he has a history of cutting but denies any recent cuts in the past year. Pt denies HI and AVH at this time. Pt is endorsing multiple depressive symptoms and shared that his sleep and appetite are fair. No significant weight changes reported. Pt denies having access to weapons or firearms. Pt reported that he drinks alcohol and smokes marijuana several times a week. No psychiatric  hospitalizations reported. No current mental health treatment reported. No trauma history report and pt denies physical, sexual and emotional abuse.  ON Evaluation:Adrian Kirby is awake, alert and oriented *3. Denies suicidal or homicidal ideation during this assessment . Denies auditory or visual hallucination and does not appear to be responding to internal stimuli. Patient appears with irritable and anxious. Support, encouragement and reassurance was provided.   Associated Signs/Symptoms: Depression Symptoms:  depressed mood, feelings of worthlessness/guilt, difficulty concentrating, (Hypo) Manic Symptoms:  Distractibility, Anxiety Symptoms:  Excessive Worry, Social Anxiety, Psychotic Symptoms:   Hallucinations: Auditory Visual PTSD Symptoms: Had a traumatic exposure:  Verbal abuse   Total Time spent with patient: 30 minutes  Past Psychiatric History:   Is the patient at risk to self? Yes.    Has the patient been a risk to self in the past 6 months? Yes.    Has the patient been a risk to self within the distant past? No.  Is the patient a risk to others? No.  Has the patient been a risk to others in the past 6 months? No.  Has the patient been a risk to others within the distant past? No.   Prior Inpatient Therapy:   Prior Outpatient Therapy:    Alcohol Screening: 1. How often do you have a drink containing alcohol?: 2 to 4 times a month 2. How many drinks containing alcohol do you have on a typical day when you are drinking?: 1 or 2 3. How often do you have six or more drinks on one occasion?: Never Preliminary Score: 0 9. Have you or someone else been injured as a result of your drinking?: No 10. Has a relative or friend or a doctor or another health worker been concerned about your drinking or suggested you cut down?: No Alcohol Use Disorder Identification Test Final Score (AUDIT): 2 Brief Intervention: AUDIT score less than 7 or less-screening does not suggest unhealthy drinking-brief intervention not indicated Substance Abuse History in the last 12 months:  Yes.   Consequences of Substance Abuse: NA Previous Psychotropic Medications: no Psychological Evaluations: no Past Medical History: History reviewed. No pertinent past medical history. History reviewed. No pertinent surgical history. Family History: History reviewed. No pertinent family history. Family Psychiatric  History: great grandmother unsure of diagnoses. Tobacco Screening: Have you used any form of tobacco in the last  30 days? (Cigarettes, Smokeless Tobacco, Cigars, and/or Pipes): Yes Tobacco use, Select all that apply: 5 or more cigarettes per day Are you interested in Tobacco Cessation Medications?: Yes,  will notify MD for an order Counseled patient on smoking cessation including recognizing danger situations, developing coping skills and basic information about quitting provided: Yes Social History:  History  Alcohol Use  . Yes    Comment: "sometimes"     History  Drug Use  . Types: Marijuana    Additional Social History:                           Allergies:  No Known Allergies Lab Results:  Results for orders placed or performed during the hospital encounter of 11/04/16 (from the past 48 hour(s))  Urine rapid drug screen (hosp performed)     Status: Abnormal   Collection Time: 11/04/16  7:09 PM  Result Value Ref Range   Opiates NONE DETECTED NONE DETECTED   Cocaine NONE DETECTED NONE DETECTED   Benzodiazepines NONE DETECTED NONE DETECTED   Amphetamines NONE DETECTED NONE DETECTED   Tetrahydrocannabinol POSITIVE (A) NONE DETECTED   Barbiturates NONE DETECTED NONE DETECTED    Comment:        DRUG SCREEN FOR MEDICAL PURPOSES ONLY.  IF CONFIRMATION IS NEEDED FOR ANY PURPOSE, NOTIFY LAB WITHIN 5 DAYS.        LOWEST DETECTABLE LIMITS FOR URINE DRUG SCREEN Drug Class       Cutoff (ng/mL) Amphetamine      1000 Barbiturate      200 Benzodiazepine   161 Tricyclics       096 Opiates          300 Cocaine          300 THC              50   Comprehensive metabolic panel     Status: Abnormal   Collection Time: 11/04/16  7:12 PM  Result Value Ref Range   Sodium 138 135 - 145 mmol/L   Potassium 3.8 3.5 - 5.1 mmol/L   Chloride 104 101 - 111 mmol/L   CO2 28 22 - 32 mmol/L   Glucose, Bld 81 65 - 99 mg/dL   BUN 8 6 - 20 mg/dL   Creatinine, Ser 0.87 0.61 - 1.24 mg/dL   Calcium 9.5 8.9 - 10.3 mg/dL   Total Protein 7.5 6.5 - 8.1 g/dL   Albumin 4.6 3.5 - 5.0 g/dL   AST 20 15 - 41 U/L   ALT 13 (L) 17 - 63 U/L   Alkaline Phosphatase 78 38 - 126 U/L   Total Bilirubin 0.6 0.3 - 1.2 mg/dL   GFR calc non Af Amer >60 >60 mL/min   GFR calc Af Amer >60 >60 mL/min    Comment:  (NOTE) The eGFR has been calculated using the CKD EPI equation. This calculation has not been validated in all clinical situations. eGFR's persistently <60 mL/min signify possible Chronic Kidney Disease.    Anion gap 6 5 - 15  Ethanol     Status: None   Collection Time: 11/04/16  7:12 PM  Result Value Ref Range   Alcohol, Ethyl (B) <5 <5 mg/dL    Comment:        LOWEST DETECTABLE LIMIT FOR SERUM ALCOHOL IS 5 mg/dL FOR MEDICAL PURPOSES ONLY   CBC with Differential     Status: None   Collection Time: 11/04/16  7:12  PM  Result Value Ref Range   WBC 9.2 4.0 - 10.5 K/uL   RBC 5.20 4.22 - 5.81 MIL/uL   Hemoglobin 16.6 13.0 - 17.0 g/dL   HCT 47.0 39.0 - 52.0 %   MCV 90.4 78.0 - 100.0 fL   MCH 31.9 26.0 - 34.0 pg   MCHC 35.3 30.0 - 36.0 g/dL   RDW 12.5 11.5 - 15.5 %   Platelets 217 150 - 400 K/uL   Neutrophils Relative % 63 %   Neutro Abs 5.9 1.7 - 7.7 K/uL   Lymphocytes Relative 23 %   Lymphs Abs 2.1 0.7 - 4.0 K/uL   Monocytes Relative 9 %   Monocytes Absolute 0.8 0.1 - 1.0 K/uL   Eosinophils Relative 4 %   Eosinophils Absolute 0.4 0.0 - 0.7 K/uL   Basophils Relative 1 %   Basophils Absolute 0.1 0.0 - 0.1 K/uL  Acetaminophen level     Status: Abnormal   Collection Time: 11/04/16  7:12 PM  Result Value Ref Range   Acetaminophen (Tylenol), Serum <10 (L) 10 - 30 ug/mL    Comment:        THERAPEUTIC CONCENTRATIONS VARY SIGNIFICANTLY. A RANGE OF 10-30 ug/mL MAY BE AN EFFECTIVE CONCENTRATION FOR MANY PATIENTS. HOWEVER, SOME ARE BEST TREATED AT CONCENTRATIONS OUTSIDE THIS RANGE. ACETAMINOPHEN CONCENTRATIONS >150 ug/mL AT 4 HOURS AFTER INGESTION AND >50 ug/mL AT 12 HOURS AFTER INGESTION ARE OFTEN ASSOCIATED WITH TOXIC REACTIONS.   Salicylate level     Status: None   Collection Time: 11/04/16  7:12 PM  Result Value Ref Range   Salicylate Lvl <7.8 2.8 - 30.0 mg/dL    Blood Alcohol level:  Lab Results  Component Value Date   ETH <5 58/85/0277    Metabolic Disorder  Labs:  No results found for: HGBA1C, MPG No results found for: PROLACTIN No results found for: CHOL, TRIG, HDL, CHOLHDL, VLDL, LDLCALC  Current Medications: Current Facility-Administered Medications  Medication Dose Route Frequency Provider Last Rate Last Dose  . acetaminophen (TYLENOL) tablet 650 mg  650 mg Oral Q6H PRN [name], [name], NP      . alum & mag hydroxide-simeth (MAALOX/MYLANTA) 200-200-20 MG/5ML suspension 30 mL  30 mL Oral Q4H PRN [name], [name], NP      . hydrOXYzine (ATARAX/VISTARIL) tablet 25 mg  25 mg Oral TID PRN Lu Duffel, [name], NP      . magnesium hydroxide (MILK OF MAGNESIA) suspension 30 mL  30 mL Oral Daily PRN [name], [name], NP      . nicotine (NICODERM CQ - dosed in mg/24 hours) patch 21 mg  21 mg Transdermal Daily Cobos, Myer Peer, MD   21 mg at 11/05/16 1205  . [START ON 11/06/2016] pneumococcal 23 valent vaccine (PNU-IMMUNE) injection 0.5 mL  0.5 mL Intramuscular Tomorrow-1000 Cobos, Fernando A, MD      . traZODone (DESYREL) tablet 50 mg  50 mg Oral QHS PRN [name], [name], NP       PTA Medications: Prescriptions Prior to Admission  Medication Sig Dispense Refill Last Dose  . dicyclomine (BENTYL) 20 MG tablet Take 1 tablet (20 mg total) by mouth every 6 (six) hours as needed for spasms (abdominal cramping). (Patient not taking: Reported on 11/04/2016) 15 tablet 0 Not Taking at Unknown time  . famotidine (PEPCID) 20 MG tablet Take 1 tablet (20 mg total) by mouth 2 (two) times daily. (Patient not taking: Reported on 11/04/2016) 30 tablet 0 Not Taking at Unknown time  Musculoskeletal: Strength & Muscle Tone: within normal limits Gait & Station: normal Patient leans: N/A  Psychiatric Specialty Exam: Physical Exam  Vitals reviewed. Constitutional: He is oriented to person, place, and time. He appears well-developed.  Cardiovascular: Normal rate.   Neurological: He is alert and oriented to person, place, and time.  Psychiatric: He has a  normal mood and affect. His behavior is normal.    Review of Systems  Psychiatric/Behavioral: Positive for depression and substance abuse. The patient is nervous/anxious.     Blood pressure 130/69, pulse (!) 59, temperature 97.6 F (36.4 C), temperature source Oral, resp. rate 16, height '5\' 7"'$  (1.702 m), weight 55.3 kg (122 lb), SpO2 100 %.Body mass index is 19.11 kg/m.  General Appearance: Casual  Eye Contact:  Fair  Speech:  Clear and Coherent  Volume:  Increased  Mood:  Anxious and Dysphoric  Affect:  Congruent  Thought Process:  Coherent  Orientation:  Full (Time, Place, and Person)  Thought Content:  Hallucinations: Visual  Suicidal Thoughts:  No  Homicidal Thoughts:  No  Memory:  Immediate;   Fair Recent;   Poor Remote;   Fair  Judgement:  Fair  Insight:  Shallow  Psychomotor Activity:  Normal  Concentration:  Concentration: Fair  Recall:  [employer] of Knowledge:  Fair  Language:  Fair  Akathisia:  No  Handed:  Right  AIMS (if indicated):     Assets:  Desire for Improvement Intimacy Physical Health Resilience  ADL's:  Intact  Cognition:  WNL  Sleep:        I agree with current treatment plan on 11/05/2016, Patient seen face-to-face for psychiatric evaluation follow-up, chart reviewed and case discussed with the MD Cobos. Reviewed the information documented and agree with the treatment plan.  Treatment Plan Summary: Daily contact with patient to assess and evaluate symptoms and progress in treatment and Medication management  Continue with Seroquel 25 mg with titration for mood stabilization. Continue with Trazodone 100 mg for insomnia Will continue to monitor vitals ,medication compliance and treatment side effects while patient is here.  Reviewed labsBAL - , UDS - positive for [facility] CSW will start working on disposition.  Patient to participate in therapeutic milieu Observation Level/Precautions:  15 minute checks  Laboratory:  CBC Chemistry  Profile HbAIC UDS  Psychotherapy:  Individual and group session  Medications:  See above  Consultations:  Psychiatry  Discharge Concerns:  5-7 days  Estimated LOS: Safety, stabilization, and risk of access to medication and medication stabilization   Other:     Physician Treatment Plan for Primary Diagnosis: MDD (major depressive disorder) Long Term Goal(s): Improvement in symptoms so as ready for discharge  Short Term Goals: Ability to identify changes in lifestyle to reduce recurrence of condition will improve, Ability to verbalize feelings will improve, Ability to disclose and discuss suicidal ideas, Ability to demonstrate self-control will improve and Ability to maintain clinical measurements within normal limits will improve  Physician Treatment Plan for Secondary Diagnosis: Principal Problem:   MDD (major depressive disorder)  Long Term Goal(s): Improvement in symptoms so as ready for discharge  Short Term Goals: Ability to maintain clinical measurements within normal limits will improve, Compliance with prescribed medications will improve and Ability to identify triggers associated with substance abuse/mental health issues will improve  I certify that inpatient services furnished can reasonably be expected to improve the patient's condition.    Derrill Center, NP 5/19/20184:12 PM

## 2016-11-06 DIAGNOSIS — F329 Major depressive disorder, single episode, unspecified: Principal | ICD-10-CM

## 2016-11-06 LAB — TSH: TSH: 3.037 u[IU]/mL (ref 0.350–4.500)
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
## 2016-11-06 NOTE — [facility] Group Notes (Signed)
[facility] Group Notes: Identifying Primary and Secondary Emotions  Date:  11/06/2016  Time:  3:16 PM  Type of Therapy:  Psychoeducational Skills  Participation Level:  Minimal  Participation Quality:  Resistant  Affect:  Blunted  Cognitive:  Alert  Insight:  Lacking  Engagement in Group:  Limited  Modes of Intervention:  Discussion and Education  Summary of Progress/Problems: Patient attended group, he was guarded.  Marzetta BoardDopson, Darilyn Storbeck E 11/06/2016, 3:16 PM

## 2016-11-06 NOTE — Progress Notes (Signed)
Adult Psychoeducational Group Note  Date:  11/06/2016 Time:  11:53 PM  Group Topic/Focus:  Wrap-Up Group:   The focus of this group is to help patients review their daily goal of treatment and discuss progress on daily workbooks.  Participation Level:  Did Not Attend  Participation Quality:  Patient did not attend  Affect:  Patient did not attend  Cognitive:  Patient did not attend  Insight: None  Engagement in Group:  Patient did not attend  Modes of Intervention:  Patient did not attend  Additional Comments:  Patient did not attend the evening wrap up group. Patient attended the AA group this evening.  Felipa FurnaceChristopher  Morgon Pamer 11/06/2016, 11:53 PM

## 2016-11-06 NOTE — Progress Notes (Signed)
Patient ID: Adrian Kirby, male   DOB: 1995-12-21, 21 y.o.   MRN: [id number]  DAR: Pt. Denies SI/HI and A/V Hallucinations. He reports sleep is poor, appetite is fair, energy level is normal, and concentration is good. He rates depression 1/10, hopelessness 2/10, and anxiety 2/10. Patient does not report pain in his neck and back that are chronic in nature but he refuses intervention at this time. Support and encouragement provided to the patient however patient remains guarded. He is minimal with Clinical research associatewriter and appears anxious in affect and mood. Scheduled medications refused by patient. Q15 minute checks are maintained for safety.

## 2016-11-06 NOTE — Progress Notes (Signed)
[facility] MD Progress Note  11/06/2016 12:45 PM Adrian Kirby  MRN:  [id number] Subjective:  Patient reports " I am extremely tired, I was unable to sleep due to my roommate snoring."  Objective:Adrian Kirby is awake, alert and oriented *3, seen resting in bedroom. Denies suicidal or homicidal ideation during this assessment. Denies auditory or visual hallucination and does not appear to be responding to internal stimuli. Patient reports he was awaken due to his roommates snoring. Patient continues to  Present with a flat affect, however is pleasant.  Patient reports he is medication compliant without mediation side effects. Reports he is tolerating the Lexapro well. States his depression 2/10. Reports good appetite. Patient report she is excited regarding discharge. Support, encouragement and reassurance was provided.   Principal Problem: MDD (major depressive disorder) Diagnosis:   Patient Active Problem List   Diagnosis Date Noted  . MDD (major depressive disorder) [F32.9] 11/05/2016   Total Time spent with patient: 30 minutes  Past Psychiatric History:   Past Medical History: History reviewed. No pertinent past medical history. History reviewed. No pertinent surgical history. Family History: History reviewed. No pertinent family history. Family Psychiatric  History:  Social History:  History  Alcohol Use  . Yes    Comment: "sometimes"     History  Drug Use  . Types: Marijuana    Social History   Social History  . Marital status: Single    Spouse name: N/A  . Number of children: N/A  . Years of education: N/A   Social History Main Topics  . Smoking status: Current Every Day Smoker    Packs/day: 1.50  . Smokeless tobacco: Current User    Types: Chew  . Alcohol use Yes     Comment: "sometimes"  . Drug use: Yes    Types: Marijuana  . Sexual activity: Not Asked   Other Topics Concern  . None   Social History Narrative  . None   Additional Social History:                          Sleep: Fair  Appetite:  Fair  Current Medications: Current Facility-Administered Medications  Medication Dose Route Frequency Provider Last Rate Last Dose  . acetaminophen (TYLENOL) tablet 650 mg  650 mg Oral Q6H PRN [name], [name], NP      . alum & mag hydroxide-simeth (MAALOX/MYLANTA) 200-200-20 MG/5ML suspension 30 mL  30 mL Oral Q4H PRN [name], [name], NP      . escitalopram (LEXAPRO) tablet 5 mg  5 mg Oral Daily Cobos, Rockey Situ, MD   5 mg at 11/05/16 1828  . hydrOXYzine (ATARAX/VISTARIL) tablet 25 mg  25 mg Oral TID PRN Beryle Lathe, [name], NP      . LORazepam (ATIVAN) tablet 1 mg  1 mg Oral Q8H PRN Cobos, Fernando A, MD      . magnesium hydroxide (MILK OF MAGNESIA) suspension 30 mL  30 mL Oral Daily PRN [name], [name], NP      . nicotine (NICODERM CQ - dosed in mg/24 hours) patch 21 mg  21 mg Transdermal Daily Cobos, Rockey Situ, MD   21 mg at 11/06/16 0819  . pneumococcal 23 valent vaccine (PNU-IMMUNE) injection 0.5 mL  0.5 mL Intramuscular Tomorrow-1000 Cobos, Fernando A, MD      . traZODone (DESYREL) tablet 50 mg  50 mg Oral QHS PRN [name], [name], NP   50 mg at 11/05/16 2356  Lab Results:  Results for orders placed or performed during the hospital encounter of 11/05/16 (from the past 48 hour(s))  TSH     Status: None   Collection Time: 11/06/16  6:27 AM  Result Value Ref Range   TSH 3.037 0.350 - 4.500 uIU/mL    Comment: Performed by a 3rd Generation assay with a functional sensitivity of <=0.01 uIU/mL. Performed at [facility], [address] [place], [place] [address]     Blood Alcohol level:  Lab Results  Component Value Date   ETH <5 11/04/2016    Metabolic Disorder Labs: No results found for: HGBA1C, MPG No results found for: PROLACTIN No results found for: CHOL, TRIG, HDL, CHOLHDL, VLDL, LDLCALC  Physical Findings: AIMS: Facial and Oral Movements Muscles of Facial Expression: None,  normal Lips and Perioral Area: None, normal Jaw: None, normal Tongue: None, normal,Extremity Movements Upper (arms, wrists, hands, fingers): None, normal Lower (legs, knees, ankles, toes): None, normal, Trunk Movements Neck, shoulders, hips: None, normal, Overall Severity Severity of abnormal movements (highest score from questions above): None, normal Incapacitation due to abnormal movements: None, normal Patient's awareness of abnormal movements (rate only patient's report): No Awareness, Dental Status Current problems with teeth and/or dentures?: No Does patient usually wear dentures?: No  CIWA:    COWS:     Musculoskeletal: Strength & Muscle Tone: within normal limits Gait & Station: normal Patient leans: N/A  Psychiatric Specialty Exam: Physical Exam  Vitals reviewed. Constitutional: He is oriented to person, place, and time. He appears well-developed.  Cardiovascular: Normal rate and regular rhythm.   Neurological: He is alert and oriented to person, place, and time.  Psychiatric: He has a normal mood and affect. His behavior is normal.    Review of Systems  Psychiatric/Behavioral: Positive for depression. The patient is nervous/anxious and has insomnia.     Blood pressure (!) 131/59, pulse (!) 49, temperature 98.6 F (37 C), temperature source Oral, resp. rate 16, height 5\' 7"  (1.702 m), weight 55.3 kg (122 lb), SpO2 100 %.Body mass index is 19.11 kg/m.  General Appearance: Casual  Eye Contact:  Fair  Speech:  Clear and Coherent  Volume:  Normal  Mood:  Anxious, Depressed and Dysphoric  Affect:  Blunt  Thought Process:  Coherent  Orientation:  Full (Time, Place, and Person)  Thought Content:  Hallucinations: None  Suicidal Thoughts:  No  Homicidal Thoughts:  No  Memory:  Immediate;   Fair  Judgement:  Fair  Insight:  Fair  Psychomotor Activity:  Normal  Concentration:  Concentration: Fair  Recall:  [employer] of Knowledge:  Fair  Language:  Fair  Akathisia:   No  Handed:  Right  AIMS (if indicated):     Assets:  Communication Skills Desire for Improvement Resilience Social Support  ADL's:  Intact  Cognition:  WNL  Sleep:  Number of Hours: 5.25     I agree with current treatment plan on 11/06/2016, Patient seen face-to-face for psychiatric evaluation follow-up, chart reviewed. Advanced Practice Provider and Treatment team. Reviewed the information documented and agree with the treatment plan.  Treatment Plan Summary: Daily contact with patient to assess and evaluate symptoms and progress in treatment and Medication management   Continue with Lexapro 5 mg mood stabilization. Continue with Trazodone 50mg  for insomnia Will continue to monitor vitals ,medication compliance and treatment side effects while patient is here.  Reviewed labsBAL - , UDS - positive for [facility] CSW will start working on disposition.  Patient to participate in therapeutic milieu   Oneta Rackanika N Aminat [place], NP 11/06/2016, 12:45 PM

## 2016-11-06 NOTE — [facility] Group Notes (Signed)
[facility] LCSW Group Therapy  11/06/2016 10:00 AM  Type of Therapy:  Group Therapy  Participation Level:  Did Not Attend; invited to participate yet did not despite overhead announcement and encouragement by staff   Summary of Progress/Problems: Topic for today was thoughts and feelings regarding discharge. We discussed fears of upcoming changes including judgements, expectations and stigma of mental health issues. We then discussed supports: what constitutes a supportive framework, identification of supports and what to do when others are not supportive. Patients then identified a specific coping tool to use when others are not available.    Carney Bernatherine C Nan Maya, LCSW

## 2016-11-06 NOTE — Progress Notes (Signed)
D.  Pt pleasant on approach, observed interacting more this shift than last.  Pt appeared brighter, spoke of how he needed to heal his brain after being on mind altering chemicals for so long.  Pt reluctant to take anything but lowest dose of any mediation due to this.  Pt denies SI/HI/hallucinations at this time.  Pt was positive for evening AA group.  A.  Support and encouragement offered medication given as ordered.  R.  Pt remains safe on the unit, will continue to monitor.

## 2016-11-06 NOTE — Progress Notes (Signed)
Adult Psychoeducational Group Note  Date:  11/06/2016 Time:  12:40 AM  Group Topic/Focus:  Wrap-Up Group:   The focus of this group is to help patients review their daily goal of treatment and discuss progress on daily workbooks.  Participation Level:  Did Not Attend  Participation Quality:  Patient did not attend  Affect:  Patient did not attend  Cognitive:  Patient did not attend  Insight: None  Engagement in Group:  Patient did not attend  Modes of Intervention:  Patient did not attend  Additional Comments:  Patient did not attend wrap up group this evening. Patient attended the AA group this evening.  Felipa FurnaceChristopher  Mihira Tozzi 11/06/2016, 12:40 AM

## 2016-11-07 DIAGNOSIS — G4700 Insomnia, unspecified: Secondary | ICD-10-CM

## 2016-11-07 NOTE — Progress Notes (Signed)
[facility] MD Progress Note  11/07/2016 1:38 PM Adrian Kirby  MRN:  [id number]  Subjective: Adrian Kirby reports, "I feel a lot better today. My mood is better. I'm sleeping well".  Objective: Adrian Kirby seen, chart reviewed. He is awake, alert and oriented X 3, seen resting in bedroom. Denies suicidal or homicidal ideation during this assessment. Denies auditory or visual hallucination and does not appear to be responding to internal stimuli. Patient presents improved affect. He is also cooperative & pleasant.  Patient reports he is medication compliant without mediation side effects. Reports he is tolerating the Lexapro well. States his depression 2/10. Reports good appetite. Patient report she is excited regarding discharge. Support, encouragement and reassurance was provided.   Principal Problem: MDD (major depressive disorder) Diagnosis:   Patient Active Problem List   Diagnosis Date Noted  . MDD (major depressive disorder) [F32.9] 11/05/2016   Total Time spent with patient: 15 minutes  Past Psychiatric History:   Past Medical History: History reviewed. No pertinent past medical history. History reviewed. No pertinent surgical history. Family History: History reviewed. No pertinent family history. Family Psychiatric  History:  Social History:  History  Alcohol Use  . Yes    Comment: "sometimes"     History  Drug Use  . Types: Marijuana    Social History   Social History  . Marital status: Single    Spouse name: N/A  . Number of children: N/A  . Years of education: N/A   Social History Main Topics  . Smoking status: Current Every Day Smoker    Packs/day: 1.50  . Smokeless tobacco: Current User    Types: Chew  . Alcohol use Yes     Comment: "sometimes"  . Drug use: Yes    Types: Marijuana  . Sexual activity: Not Asked   Other Topics Concern  . None   Social History Narrative  . None   Additional Social History:   Sleep: Good  Appetite:  Good  Current  Medications: Current Facility-Administered Medications  Medication Dose Route Frequency Provider Last Rate Last Dose  . acetaminophen (TYLENOL) tablet 650 mg  650 mg Oral Q6H PRN [name], [name], NP      . alum & mag hydroxide-simeth (MAALOX/MYLANTA) 200-200-20 MG/5ML suspension 30 mL  30 mL Oral Q4H PRN [name], [name], NP      . escitalopram (LEXAPRO) tablet 5 mg  5 mg Oral Daily Cobos, Fernando A, MD   5 mg at 11/05/16 1828  . hydrOXYzine (ATARAX/VISTARIL) tablet 25 mg  25 mg Oral TID PRN Ferne Reus A, NP   25 mg at 11/06/16 2304  . LORazepam (ATIVAN) tablet 1 mg  1 mg Oral Q8H PRN Cobos, Fernando A, MD      . magnesium hydroxide (MILK OF MAGNESIA) suspension 30 mL  30 mL Oral Daily PRN [name], [name], NP      . nicotine (NICODERM CQ - dosed in mg/24 hours) patch 21 mg  21 mg Transdermal Daily Cobos, Rockey Situ, MD   21 mg at 11/07/16 0805  . pneumococcal 23 valent vaccine (PNU-IMMUNE) injection 0.5 mL  0.5 mL Intramuscular Tomorrow-1000 Cobos, Fernando A, MD      . traZODone (DESYREL) tablet 50 mg  50 mg Oral QHS PRN [name], [name], NP   50 mg at 11/05/16 2356    Lab Results:  Results for orders placed or performed during the hospital encounter of 11/05/16 (from the past 48 hour(s))  TSH     Status: None  Collection Time: 11/06/16  6:27 AM  Result Value Ref Range   TSH 3.037 0.350 - 4.500 uIU/mL    Comment: Performed by a 3rd Generation assay with a functional sensitivity of <=0.01 uIU/mL. Performed at [facility], [address] [place], [place] [address]    Blood Alcohol level:  Lab Results  Component Value Date   ETH <5 11/04/2016   Metabolic Disorder Labs: No results found for: HGBA1C, MPG No results found for: PROLACTIN No results found for: CHOL, TRIG, HDL, CHOLHDL, VLDL, LDLCALC  Physical Findings: AIMS: Facial and Oral Movements Muscles of Facial Expression: None, normal Lips and Perioral Area: None, normal Jaw: None,  normal Tongue: None, normal,Extremity Movements Upper (arms, wrists, hands, fingers): None, normal Lower (legs, knees, ankles, toes): None, normal, Trunk Movements Neck, shoulders, hips: None, normal, Overall Severity Severity of abnormal movements (highest score from questions above): None, normal Incapacitation due to abnormal movements: None, normal Patient's awareness of abnormal movements (rate only patient's report): No Awareness, Dental Status Current problems with teeth and/or dentures?: No Does patient usually wear dentures?: No  CIWA:    COWS:     Musculoskeletal: Strength & Muscle Tone: within normal limits Gait & Station: normal Patient leans: N/A  Psychiatric Specialty Exam: Physical Exam  Vitals reviewed. Constitutional: He is oriented to person, place, and time. He appears well-developed.  Cardiovascular: Normal rate and regular rhythm.   Neurological: He is alert and oriented to person, place, and time.  Psychiatric: He has a normal mood and affect. His behavior is normal.    Review of Systems  Psychiatric/Behavioral: Positive for depression. The patient is nervous/anxious and has insomnia.     Blood pressure 120/70, pulse 69, temperature 97.8 F (36.6 C), temperature source Oral, resp. rate 16, height 5\' 7"  (1.702 m), weight 55.3 kg (122 lb), SpO2 100 %.Body mass index is 19.11 kg/m.  General Appearance: Casual  Eye Contact:  Fair  Speech:  Clear and Coherent  Volume:  Normal  Mood:  "Improving"  Affect:  Appropriate  Thought Process:  Coherent and Descriptions of Associations: Intact  Orientation:  Full (Time, Place, and Person)  Thought Content:  Denies hallucinations, delusional thoughts or paranoia.  Suicidal Thoughts:  Denies any thoughts, plans or intent.  Homicidal Thoughts:  No  Memory:  Immediate;   Fair  Judgement:  Fair  Insight:  Fair  Psychomotor Activity:  Normal  Concentration:  Concentration: Fair  Recall:  [employer]Fair  Fund of Knowledge:   Fair  Language:  Fair  Akathisia:  No  Handed:  Right  AIMS (if indicated):     Assets:  Communication Skills Desire for Improvement Resilience Social Support  ADL's:  Intact  Cognition:  WNL  Sleep:  Number of Hours: 6   Treatment Plan Summary: Daily contact with patient to assess and evaluate symptoms and progress in treatment and Medication management   Will continue today 11/07/16 plan as below except where it is noted.  Depression: Continue with Lexapro 5 mg mood.  Insomnia: Continue with Trazodone 50 mg Q hs.  Will continue to monitor vitals ,medication compliance and treatment side effects while patient is here.   Reviewed labsBAL - , U[facility]  CSW will start working on disposition.   Patient to participate in therapeutic milieu  Sanjuana KavaNwoko, Agnes I, NP, PMHNP, FNP-BC. 11/07/2016, 1:38 PMPatient ID: Adrian DamesMichael Kirby, male   DOB: September 16, 1995, 21 y.o.   MRN: [id number]

## 2016-11-07 NOTE — Tx Team (Signed)
Interdisciplinary Treatment and Diagnostic Plan Update  11/07/2016 Time of Session: 0930 Adrian Kirby MRN: [id number]  Principal Diagnosis: MDD (major depressive disorder)  Secondary Diagnoses: Principal Problem:   MDD (major depressive disorder)   Current Medications:  Current Facility-Administered Medications  Medication Dose Route Frequency Provider Last Rate Last Dose  . acetaminophen (TYLENOL) tablet 650 mg  650 mg Oral Q6H PRN [name], [name], NP      . alum & mag hydroxide-simeth (MAALOX/MYLANTA) 200-200-20 MG/5ML suspension 30 mL  30 mL Oral Q4H PRN [name], [name], NP      . escitalopram (LEXAPRO) tablet 5 mg  5 mg Oral Daily Cobos, Fernando A, MD   5 mg at 11/05/16 1828  . hydrOXYzine (ATARAX/VISTARIL) tablet 25 mg  25 mg Oral TID PRN [name], [name], NP   25 mg at 11/06/16 2304  . LORazepam (ATIVAN) tablet 1 mg  1 mg Oral Q8H PRN Cobos, Fernando A, MD      . magnesium hydroxide (MILK OF MAGNESIA) suspension 30 mL  30 mL Oral Daily PRN [name], [name], NP      . nicotine (NICODERM CQ - dosed in mg/24 hours) patch 21 mg  21 mg Transdermal Daily Cobos, [name], MD   21 mg at 11/07/16 0805  . pneumococcal 23 valent vaccine (PNU-IMMUNE) injection 0.5 mL  0.5 mL Intramuscular Tomorrow-1000 Cobos, Fernando A, MD      . traZODone (DESYREL) tablet 50 mg  50 mg Oral QHS PRN [name], [name], NP   50 mg at 11/05/16 2356   PTA Medications: Prescriptions Prior to Admission  Medication Sig Dispense Refill Last Dose  . dicyclomine (BENTYL) 20 MG tablet Take 1 tablet (20 mg total) by mouth every 6 (six) hours as needed for spasms (abdominal cramping). (Patient not taking: Reported on 11/04/2016) 15 tablet 0 Not Taking at Unknown time  . famotidine (PEPCID) 20 MG tablet Take 1 tablet (20 mg total) by mouth 2 (two) times daily. (Patient not taking: Reported on 11/04/2016) 30 tablet 0 Not Taking at Unknown time    Patient Stressors: Loss of friend Substance abuse  Patient  Strengths: Ability for insight Average or above average intelligence Motivation for treatment/growth Supportive family/friends  Treatment Modalities: Medication Management, Group therapy, Case management,  1 to 1 session with clinician, Psychoeducation, Recreational therapy.   Physician Treatment Plan for Primary Diagnosis: MDD (major depressive disorder) Long Term Goal(s): Improvement in symptoms so as ready for discharge Improvement in symptoms so as ready for discharge   Short Term Goals: Ability to identify changes in lifestyle to reduce recurrence of condition will improve Ability to verbalize feelings will improve Ability to disclose and discuss suicidal ideas Ability to demonstrate self-control will improve Ability to maintain clinical measurements within normal limits will improve Ability to maintain clinical measurements within normal limits will improve Compliance with prescribed medications will improve Ability to identify triggers associated with substance abuse/mental health issues will improve  Medication Management: Evaluate patient's response, side effects, and tolerance of medication regimen.  Therapeutic Interventions: 1 to 1 sessions, Unit Group sessions and Medication administration.  Evaluation of Outcomes: Progressing  Physician Treatment Plan for Secondary Diagnosis: Principal Problem:   MDD (major depressive disorder)  Long Term Goal(s): Improvement in symptoms so as ready for discharge Improvement in symptoms so as ready for discharge   Short Term Goals: Ability to identify changes in lifestyle to reduce recurrence of condition will improve Ability to verbalize feelings will improve Ability to disclose and  discuss suicidal ideas Ability to demonstrate self-control will improve Ability to maintain clinical measurements within normal limits will improve Ability to maintain clinical measurements within normal limits will improve Compliance with prescribed  medications will improve Ability to identify triggers associated with substance abuse/mental health issues will improve     Medication Management: Evaluate patient's response, side effects, and tolerance of medication regimen.  Therapeutic Interventions: 1 to 1 sessions, Unit Group sessions and Medication administration.  Evaluation of Outcomes: Progressing   RN Treatment Plan for Primary Diagnosis: MDD (major depressive disorder) Long Term Goal(s): Knowledge of disease and therapeutic regimen to maintain health will improve  Short Term Goals: Ability to remain free from injury will improve, Ability to verbalize feelings will improve and Ability to disclose and discuss suicidal ideas  Medication Management: RN will administer medications as ordered by provider, will assess and evaluate patient's response and provide education to patient for prescribed medication. RN will report any adverse and/or side effects to prescribing provider.  Therapeutic Interventions: 1 on 1 counseling sessions, Psychoeducation, Medication administration, Evaluate responses to treatment, Monitor vital signs and CBGs as ordered, Perform/monitor CIWA, COWS, AIMS and Fall Risk screenings as ordered, Perform wound care treatments as ordered.  Evaluation of Outcomes: Progressing   LCSW Treatment Plan for Primary Diagnosis: MDD (major depressive disorder) Long Term Goal(s): Safe transition to appropriate next level of care at discharge, Engage patient in therapeutic group addressing interpersonal concerns.  Short Term Goals: Engage patient in aftercare planning with referrals and resources, Facilitate patient progression through stages of change regarding substance use diagnoses and concerns and Identify triggers associated with mental health/substance abuse issues  Therapeutic Interventions: Assess for all discharge needs, 1 to 1 time with Social worker, Explore available resources and support systems, Assess for  adequacy in community support network, Educate family and significant other(s) on suicide prevention, Complete Psychosocial Assessment, Interpersonal group therapy.  Evaluation of Outcomes: Progressing   Progress in Treatment: Attending groups: Yes. Participating in groups: Yes. Taking medication as prescribed: Yes. Toleration medication: Yes. Family/Significant other contact made: No, will contact:  pt's friend if he is able to provide contact number. SPE also completed with patient Patient understands diagnosis: Yes. Discussing patient identified problems/goals with staff: Yes. Medical problems stabilized or resolved: Yes. Denies suicidal/homicidal ideation: Yes. Issues/concerns per patient self-inventory: No. Other: n/a   New problem(s) identified: No, Describe:  n/a  New Short Term/Long Term Goal(s): detox; medication stabilization; development of comprehensive mental wellness/sobriety plan.   Discharge Plan or Barriers: CSW assessing--pt to be referred to [facility] for outpatient services. Plans to return home with roommate and per pt, can secure a ride home at discharge.   Reason for Continuation of Hospitalization: Depression Medication stabilization Withdrawal symptoms  Estimated Length of Stay: 2-3 days   Attendees: Patient: 11/07/2016 9:57 AM  Physician: Dr. Jama Flavors MD 11/07/2016 9:57 AM  Nursing: Lorelle Gibbs RN 11/07/2016 9:57 AM  RN Care Manager: Onnie Boer CM 11/07/2016 9:57 AM  Social Worker: Trula Slade, LCSW 11/07/2016 9:57 AM  Recreational Therapist: x 11/07/2016 9:57 AM  Other: Armandina Stammer NP; Gray Bernhardt NP 11/07/2016 9:57 AM  Other:  11/07/2016 9:57 AM  Other: 11/07/2016 9:57 AM    Scribe for Treatment Team: Ledell Peoples Smart, LCSW 11/07/2016 9:57 AM

## 2016-11-07 NOTE — Progress Notes (Signed)
Recreation Therapy Notes  Date: 11/07/16 Time: 0930 Location: [address] Dayroom  Group Topic: Stress Management  Goal Area(s) Addresses:  Patient will verbalize importance of using healthy stress management.  Patient will identify positive emotions associated with healthy stress management.   Intervention: Stress Management  Activity :  Letting Go.  LRT introduced the stress management technique of meditation.  LRT played a meditation on the importance of letting go of the past and things that hold us back.  Patients were to follow along as the meditation played to participate in the activity.    Education:  Stress Management, Discharge Planning.   Education Outcome: Acknowledges edcuation/In group clarification offered/Needs additional education  Clinical Observations/Feedback: Pt did not attend group.    Caroll RancherMarjette Carlester Kasparek, LRT/CTRS         Lillia AbedLindsay, Erynne Kealey A 11/07/2016 11:50 AM

## 2016-11-07 NOTE — [facility] Group Notes (Signed)
[facility] LCSW Group Therapy  11/07/2016 11:25 AM  Type of Therapy:  Group Therapy  Participation Level:  Active  Participation Quality:  Attentive  Affect:  Appropriate  Cognitive:  Alert and Oriented  Insight:  Engaged  Engagement in Therapy:  Engaged  Modes of Intervention:  Confrontation, Discussion, Education, Problem-solving, Socialization and Support  Summary of Progress/Problems: Today's Topic: Overcoming Obstacles. Patients identified one short term goal and potential obstacles in reaching this goal. Patients processed barriers involved in overcoming these obstacles. Patients identified steps necessary for overcoming these obstacles and explored motivation (internal and external) for facing these difficulties head on. 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 He decided against inpatient treatment but is open to getting information.   [name] N Smart LCSW 11/07/2016, 11:25 AM

## 2016-11-07 NOTE — Progress Notes (Signed)
Adult Psychoeducational Group Note  Date:  11/07/2016 Time:  10:56 PM  Group Topic/Focus:  Wrap-Up Group:   The focus of this group is to help patients review their daily goal of treatment and discuss progress on daily workbooks.  Participation Level:  Active  Participation Quality:  Appropriate and Attentive  Affect:  Appropriate  Cognitive:  Appropriate  Insight: Appropriate  Engagement in Group:  Engaged  Modes of Intervention:  Discussion  Additional Comments:  Pt stated his goal is to redirect his focus to more productive tasks. Pt stated 2 coping skills are doing games with numbers and equations and writing poetry and music.  Caswell CorwinOwen, Goldy Calandra C 11/07/2016, 10:56 PM

## 2016-11-07 NOTE — Progress Notes (Signed)
Patient ID: Arther DamesMichael Werber, male   DOB: 1995-07-04, 21 y.o.   MRN: [id number] D: client visible on the unit, reports "really good day, felt relaxed since I been here, redirecting my focus, doing puzzles, socializing, been outside" Client seen in dayroom interacting with peers, playing games. A: Writer provided emotional support, encouraging client to verbalize his feelings. Medications reviewed, administered as ordered. Staff will monitor q7315min for safety. R: Client is safe on the unit, attended group.

## 2016-11-07 NOTE — Progress Notes (Signed)
Patient noted on the denies SI, HI and AVH.  Patient refused medications this shift and stated "I only take medications at night I need to be alone and relaxed for them to work"    Assess patient for safety offer medications as prescribed, engage patient in 1:1 staff talks.   Continue to monitor as prescribed.  Patient able to contract for safety.

## 2016-11-08 ENCOUNTER — Encounter (HOSPITAL_COMMUNITY): Payer: Self-pay | Admitting: Psychiatry

## 2016-11-08 DIAGNOSIS — F1290 Cannabis use, unspecified, uncomplicated: Secondary | ICD-10-CM

## 2016-11-08 DIAGNOSIS — F17210 Nicotine dependence, cigarettes, uncomplicated: Secondary | ICD-10-CM

## 2016-11-08 DIAGNOSIS — F1994 Other psychoactive substance use, unspecified with psychoactive substance-induced mood disorder: Secondary | ICD-10-CM

## 2016-11-08 MED ORDER — ESCITALOPRAM OXALATE 10 MG PO TABS
10.0000 mg | ORAL_TABLET | Freq: Every day | ORAL | Status: DC
  Filled 2016-11-08: qty 7
  Filled 2016-11-08: qty 1
  Filled 2016-11-08: qty 7
  Filled 2016-11-08: qty 1

## 2016-11-08 MED ORDER — ESCITALOPRAM OXALATE 5 MG PO TABS
5.0000 mg | ORAL_TABLET | Freq: Once | ORAL | Status: DC
  Filled 2016-11-08: qty 1

## 2016-11-08 NOTE — Progress Notes (Signed)
DAR NOTE: Patient presents with anxious affect and mood.  Denies pain, auditory and visual hallucinations.  Described energy level as hyper and concentration as good.  Rates depression at 0, hopelessness at 0, and anxiety at 0.  Maintained on routine safety checks.  Medications given as prescribed.  Support and encouragement offered as needed.  Attended group and participated.  States goal for today is "to be discharge, go to work and start a better life."  Patient observed socializing with peers in the dayroom.  Patient became very anxious and nervous after MD assessed him for discharge.  Ativan 1 mg given with good effect.  Patient reminded to do deep breathing exercise.  Verbalizes understanding of coping skills.

## 2016-11-08 NOTE — Progress Notes (Signed)
Patient ID: Adrian DamesMichael Kirby, male   DOB: 1996/05/22, 21 y.o.   MRN: [id number] D: Client in dayroom watching TV, playing cards, and interacting with peers. Client reports problems sleeping. "I ain't taking no medication til eleven o'clock, cause it's hard for me to sleep, I don't mean to be rude, but I ain't taking no medications til eleven o'clock" A: Writer provided emotional support, medications reviewed, administered as ordered. Staff will monitor q515min for safety. R: Client is safe on the unit, attended group.

## 2016-11-08 NOTE — Plan of Care (Signed)
Problem: Activity: Goal: Interest or engagement in leisure activities will improve Outcome: Progressing Engagement in leisure activities will improve AEB "felt relaxed since I been here, redirecting my focus, doing puzzles, socializing, going outside"

## 2016-11-08 NOTE — Plan of Care (Signed)
Problem: Safety: Goal: Periods of time without injury will increase Outcome: Progressing Patient is safe on the unit.  No injury reported or noted.   

## 2016-11-08 NOTE — Progress Notes (Signed)
Recreation Therapy Notes  Animal-Assisted Activity (AAA) Program Checklist/Progress Notes Patient Eligibility Criteria Checklist & Daily Group note for Rec TxIntervention  Date: 05.22.2018 Time: 2:45pm [address]   AAA/T Program Assumption of Risk Form signed by Patient/ or Parent Legal Guardian Yes  Patient is free of allergies or sever asthma Yes  Patient reports no fear of animals Yes  Patient reports no history of cruelty to animals Yes  Patient understands his/her participation is voluntary Yes  Patient washes hands before animal contact Yes  Patient washes hands after animal contact Yes  Behavioral Response: Appropriate   Education:Hand Washing, Appropriate Animal Interaction   Education Outcome: Acknowledges education.   Clinical Observations/Feedback: Patient attended session and interacted appropriately with therapy dog and peers. Patient asked appropriate questions about therapy dog and his training.    Marykay Lexenise L Terrie Grajales, LRT/CTRS        Judene Logue L 11/08/2016 3:12 PM

## 2016-11-08 NOTE — Progress Notes (Addendum)
  [facility] :  Will you be returning to the same living situation after discharge:  Yes,  home At discharge, do you have transportation home?: Yes,  friend-discharge rescheduled for Wed.  Do you have the ability to pay for your medications: Yes,  mental health  Release of information consent forms completed and submitted to medical records by CSW.  Patient to Follow up at: Follow-up Information    [facility] Follow up on 11/10/2016.   Why:  Hospital follow-up at 9:00AM. Please bring: Photo ID, Social [employer] Card, any proof of household income. Thank you.  Contact information: [address] [place] [id number] [phone]           Next level of care provider [facility] Link:no  Safety Planning and Suicide Prevention discussed: Yes,  SPE completed with pt; pt declined to consent to family contact. SPI pamphlet and Mobile Crisis information provided to pt.   Have you used any form of tobacco in the last 30 days? (Cigarettes, Smokeless Tobacco, Cigars, and/or Pipes): Yes  Has patient been referred to the Quitline?: Patient refused referral  Patient has been referred for addiction treatment: Yes  Adrian Kirby Smart LCSW 11/08/2016, 9:11 AM

## 2016-11-08 NOTE — Tx Team (Signed)
Interdisciplinary Treatment and Diagnostic Plan Update  11/08/2016 Time of Session: 0930 Adrian Kirby MRN: [id number]  Principal Diagnosis: MDD (major depressive disorder)  Secondary Diagnoses: Principal Problem:   MDD (major depressive disorder)   Current Medications:  Current Facility-Administered Medications  Medication Dose Route Frequency Provider Last Rate Last Dose  . acetaminophen (TYLENOL) tablet 650 mg  650 mg Oral Q6H PRN [name], [name], NP      . alum & mag hydroxide-simeth (MAALOX/MYLANTA) 200-200-20 MG/5ML suspension 30 mL  30 mL Oral Q4H PRN [name], [name], NP      . escitalopram (LEXAPRO) tablet 5 mg  5 mg Oral Daily Cobos, Fernando A, MD   5 mg at 11/05/16 1828  . hydrOXYzine (ATARAX/VISTARIL) tablet 25 mg  25 mg Oral TID PRN [name], [name], NP   25 mg at 11/06/16 2304  . LORazepam (ATIVAN) tablet 1 mg  1 mg Oral Q8H PRN Cobos, Fernando A, MD      . magnesium hydroxide (MILK OF MAGNESIA) suspension 30 mL  30 mL Oral Daily PRN [name], [name], NP      . nicotine (NICODERM CQ - dosed in mg/24 hours) patch 21 mg  21 mg Transdermal Daily Cobos, Fernando A, MD   21 mg at 11/08/16 0821  . pneumococcal 23 valent vaccine (PNU-IMMUNE) injection 0.5 mL  0.5 mL Intramuscular Tomorrow-1000 Cobos, Fernando A, MD      . traZODone (DESYREL) tablet 50 mg  50 mg Oral QHS PRN [name], [name], NP   50 mg at 11/07/16 2238   PTA Medications: Prescriptions Prior to Admission  Medication Sig Dispense Refill Last Dose  . dicyclomine (BENTYL) 20 MG tablet Take 1 tablet (20 mg total) by mouth every 6 (six) hours as needed for spasms (abdominal cramping). (Patient not taking: Reported on 11/04/2016) 15 tablet 0 Not Taking at Unknown time  . famotidine (PEPCID) 20 MG tablet Take 1 tablet (20 mg total) by mouth 2 (two) times daily. (Patient not taking: Reported on 11/04/2016) 30 tablet 0 Not Taking at Unknown time    Patient Stressors: Loss of friend Substance abuse  Patient  Strengths: Ability for insight Average or above average intelligence Motivation for treatment/growth Supportive family/friends  Treatment Modalities: Medication Management, Group therapy, Case management,  1 to 1 session with clinician, Psychoeducation, Recreational therapy.   Physician Treatment Plan for Primary Diagnosis: MDD (major depressive disorder) Long Term Goal(s): Improvement in symptoms so as ready for discharge Improvement in symptoms so as ready for discharge   Short Term Goals: Ability to identify changes in lifestyle to reduce recurrence of condition will improve Ability to verbalize feelings will improve Ability to disclose and discuss suicidal ideas Ability to demonstrate self-control will improve Ability to maintain clinical measurements within normal limits will improve Ability to maintain clinical measurements within normal limits will improve Compliance with prescribed medications will improve Ability to identify triggers associated with substance abuse/mental health issues will improve  Medication Management: Evaluate patient's response, side effects, and tolerance of medication regimen.  Therapeutic Interventions: 1 to 1 sessions, Unit Group sessions and Medication administration.  Evaluation of Outcomes: Met  Physician Treatment Plan for Secondary Diagnosis: Principal Problem:   MDD (major depressive disorder)  Long Term Goal(s): Improvement in symptoms so as ready for discharge Improvement in symptoms so as ready for discharge   Short Term Goals: Ability to identify changes in lifestyle to reduce recurrence of condition will improve Ability to verbalize feelings will improve Ability to disclose and   discuss suicidal ideas Ability to demonstrate self-control will improve Ability to maintain clinical measurements within normal limits will improve Ability to maintain clinical measurements within normal limits will improve Compliance with prescribed  medications will improve Ability to identify triggers associated with substance abuse/mental health issues will improve     Medication Management: Evaluate patient's response, side effects, and tolerance of medication regimen.  Therapeutic Interventions: 1 to 1 sessions, Unit Group sessions and Medication administration.  Evaluation of Outcomes: Met   RN Treatment Plan for Primary Diagnosis: MDD (major depressive disorder) Long Term Goal(s): Knowledge of disease and therapeutic regimen to maintain health will improve  Short Term Goals: Ability to remain free from injury will improve, Ability to verbalize feelings will improve and Ability to disclose and discuss suicidal ideas  Medication Management: RN will administer medications as ordered by provider, will assess and evaluate patient's response and provide education to patient for prescribed medication. RN will report any adverse and/or side effects to prescribing provider.  Therapeutic Interventions: 1 on 1 counseling sessions, Psychoeducation, Medication administration, Evaluate responses to treatment, Monitor vital signs and CBGs as ordered, Perform/monitor CIWA, COWS, AIMS and Fall Risk screenings as ordered, Perform wound care treatments as ordered.  Evaluation of Outcomes: Met   LCSW Treatment Plan for Primary Diagnosis: MDD (major depressive disorder) Long Term Goal(s): Safe transition to appropriate next level of care at discharge, Engage patient in therapeutic group addressing interpersonal concerns.  Short Term Goals: Engage patient in aftercare planning with referrals and resources, Facilitate patient progression through stages of change regarding substance use diagnoses and concerns and Identify triggers associated with mental health/substance abuse issues  Therapeutic Interventions: Assess for all discharge needs, 1 to 1 time with Social worker, Explore available resources and support systems, Assess for adequacy in  community support network, Educate family and significant other(s) on suicide prevention, Complete Psychosocial Assessment, Interpersonal group therapy.  Evaluation of Outcomes: Met   Progress in Treatment: Attending groups: Yes. Participating in groups: Yes. Taking medication as prescribed: Yes. Toleration medication: Yes. Family/Significant other contact made: SPE completed with pt; pt unable to find contact information for friend.  Patient understands diagnosis: Yes. Discussing patient identified problems/goals with staff: Yes. Medical problems stabilized or resolved: Yes. Denies suicidal/homicidal ideation: Yes. Issues/concerns per patient self-inventory: No. Other: n/a   New problem(s) identified: No, Describe:  n/a  New Short Term/Long Term Goal(s): detox; medication stabilization; development of comprehensive mental wellness/sobriety plan.   Discharge Plan or Barriers: Pt plans to return home; follow-up made at [facility].    Reason for Continuation of Hospitalization: none  Estimated Length of Stay: discharge today  Attendees: Patient: 11/08/2016 9:12 AM  Physician: Dr. Sanjuana Letters MD 11/08/2016 9:12 AM  Nursing: Ilda Basset RN 11/08/2016 9:12 AM  RN Care Manager: Lars Pinks CM 11/08/2016 9:12 AM  Social Worker: Maxie Better, LCSW 11/08/2016 9:12 AM  Recreational Therapist: x 11/08/2016 9:12 AM  Other: Lindell Spar NP 11/08/2016 9:12 AM  Other:  11/08/2016 9:12 AM  Other: 11/08/2016 9:12 AM    Scribe for Treatment Team: Kimber Relic Smart, LCSW 11/08/2016 9:12 AM

## 2016-11-08 NOTE — [facility] Group Notes (Signed)

## 2016-11-08 NOTE — [facility] Group Notes (Signed)
[facility] LCSW Group Therapy  11/08/2016 2:21 PM  Type of Therapy:  Group Therapy  Participation Level:  Active  Participation Quality:  Attentive  Affect:  Appropriate  Cognitive:  Oriented  Insight:  Engaged  Engagement in Therapy:  Engaged  Modes of Intervention:  Confrontation, Discussion, Education, Problem-solving, Socialization and Support  Summary of Progress/Problems: MHA speaker did not come today. CSW provided handouts and educational information pertaining to groups and services offered by the [facility]. Patients also discussed obstacles/barriers to discharge and ways to cope with stress/anxiety/depression in addition to medication management. Casimiro NeedleMichael was attentive and engaged during today's processing group. He shared that he is looking forward to returning home tomorrow. Casimiro NeedleMichael shared some coping skills that he is using to deal with stress and anxiety with the group. "I like to run cold water over my head and hair to help ground me when I'm feeling anxious."   Pulte HomesHeather N Smart LCSW 11/08/2016, 2:21 PM

## 2016-11-08 NOTE — [facility] Suicide Risk Assessment (Signed)
[facility] INPATIENT:  Family/Significant Other Suicide Prevention Education  Suicide Prevention Education:  Education Completed; Adrian Kirby (pt's friend/roomate) [phone] has been identified by the patient as the family member/significant other with whom the patient will be residing, and identified as the person(s) who will aid the patient in the event of a mental health crisis (suicidal ideations/suicide attempt).  With written consent from the patient, the family member/significant other has been provided the following suicide prevention education, prior to the and/or following the discharge of the patient.  The suicide prevention education provided includes the following:  Suicide risk factors  Suicide prevention and interventions  National Suicide Hotline telephone number  [facility] assessment telephone number  [facility] Emergency Assistance 911  [facility] and/or Residential Mobile Crisis Unit telephone number  Request made of family/significant other to:  Remove weapons (e.g., guns, rifles, knives), all items previously/currently identified as safety concern.    Remove drugs/medications (over-the-counter, prescriptions, illicit drugs), all items previously/currently identified as a safety concern.  The family member/significant other verbalizes understanding of the suicide prevention education information provided.  The family member/significant other agrees to remove the items of safety concern listed above.  Adrian Kirby has no concerns regarding patient returning home and states that he only gun in the home is his and he will lock up in closet and carry key on his person. MD notified of contact with pt's roommate.   [name] N Smart LCSW 11/08/2016, 2:22 PM

## 2016-11-08 NOTE — [facility] Suicide Risk Assessment (Signed)
[facility] INPATIENT:  Family/Significant Other Suicide Prevention Education  Suicide Prevention Education:  Patient Refusal for Family/Significant Other Suicide Prevention Education: The patient Adrian Kirby has refused to provide written consent for family/significant other to be provided Family/Significant Other Suicide Prevention Education during admission and/or prior to discharge.  Physician notified.  SPE completed with pt, as pt refused to consent to family contact. SPI pamphlet provided to pt and pt was encouraged to share information with support network, ask questions, and talk about any concerns relating to SPE. Pt denies access to guns/firearms and verbalized understanding of information provided. Mobile Crisis information also provided to pt.   Tay Whitwell N Smart LCSW 11/08/2016, 9:10 AM

## 2016-11-08 NOTE — Progress Notes (Signed)
[facility] MD Progress Note  11/08/2016 11:49 AM Adrian Kirby  MRN:  [id number] Subjective:   21 yo Caucasian male, single, employed, lives with a roommate. History of SUD (THC, MDMA, Dextromethorphan), self mutilation and mood disorder. Self presented to the ER. Says he was referred from his out-patient providers. Expressed suicidal thoughts at presentation. At reviewed on the unit stated that he was not actually having any intent to act. Vague on his main stressors. Mentioned loss of a relationship weeks earlier. Later stated that it was many years ago.  Chart reviewed today. Patient discussed at team  Staff notes that he has been focused on being discharged today. He refused any collateral from his roommate. He had earlier told staff that there was no weapons in the home. He has been sociable on the unit. Has repeatedly denied any suicidal or homicidal thoughts. Has not been observed to be internally stimulated. Refused his medication yesterday.   Seen today. Anxious looking. Very focused on discharge. Tells me that he was really not suicidal. Says he attempted once in 2013. Says he has promised himself never to attempt to kill self since then. Says effect it had on his family was main motivation not to kill self. Says he still has intrusive thoughts off and on but he is able to distract self. Says he went to his outpatient provider as he had been off work for three days. He did not want to lose his job. Says now he is reassured his job is there for him. Disclosed that his roommate actually owns a Location [employer]. Importance of collateral  and measures to manage means of suicide was discussed. He has agreed to collateral from his roommate and gun protocol. No perceptual abnormalities reported. No thoughts of violence or homicide. Denies any cravings for substances.  Principal Problem: MDD (major depressive disorder) Diagnosis:   Patient Active Problem List   Diagnosis Date Noted  . MDD (major depressive  disorder) [F32.9] 11/05/2016   Total Time spent with patient: 30 minutes  Past Psychiatric History: As in H&P  Past Medical History: History reviewed. No pertinent past medical history. History reviewed. No pertinent surgical history. Family History: History reviewed. No pertinent family history. Family Psychiatric  History: As in H&P Social History:  History  Alcohol Use  . Yes    Comment: "sometimes"     History  Drug Use  . Types: Marijuana    Social History   Social History  . Marital status: Single    Spouse name: N/A  . Number of children: N/A  . Years of education: N/A   Social History Main Topics  . Smoking status: Current Every Day Smoker    Packs/day: 1.50  . Smokeless tobacco: Current User    Types: Chew  . Alcohol use Yes     Comment: "sometimes"  . Drug use: Yes    Types: Marijuana  . Sexual activity: Not Asked   Other Topics Concern  . None   Social History Narrative  . None   Additional Social History:       Sleep: Good  Appetite:  Good  Current Medications: Current Facility-Administered Medications  Medication Dose Route Frequency Provider Last Rate Last Dose  . acetaminophen (TYLENOL) tablet 650 mg  650 mg Oral Q6H PRN [name], [name], NP      . alum & mag hydroxide-simeth (MAALOX/MYLANTA) 200-200-20 MG/5ML suspension 30 mL  30 mL Oral Q4H PRN [name], [name], NP      . escitalopram (LEXAPRO)  tablet 5 mg  5 mg Oral Daily Cobos, [name], MD   5 mg at 11/05/16 1828  . hydrOXYzine (ATARAX/VISTARIL) tablet 25 mg  25 mg Oral TID PRN [name], [name], NP   25 mg at 11/06/16 2304  . LORazepam (ATIVAN) tablet 1 mg  1 mg Oral Q8H PRN Cobos, Fernando A, MD      . magnesium hydroxide (MILK OF MAGNESIA) suspension 30 mL  30 mL Oral Daily PRN [name], [name], NP      . nicotine (NICODERM CQ - dosed in mg/24 hours) patch 21 mg  21 mg Transdermal Daily Cobos, [name], MD   21 mg at 11/08/16 [id number]  . pneumococcal 23 valent vaccine  (PNU-IMMUNE) injection 0.5 mL  0.5 mL Intramuscular Tomorrow-1000 Cobos, Fernando A, MD      . traZODone (DESYREL) tablet 50 mg  50 mg Oral QHS PRN [name], [name], NP   50 mg at 11/07/16 2238    Lab Results: No results found for this or any previous visit (from the past 48 hour(s)).  Blood Alcohol level:  Lab Results  Component Value Date   ETH <5 11/04/2016    Metabolic Disorder Labs: No results found for: HGBA1C, MPG No results found for: PROLACTIN No results found for: CHOL, TRIG, HDL, CHOLHDL, VLDL, LDLCALC  Physical Findings: AIMS: Facial and Oral Movements Muscles of Facial Expression: None, normal Lips and Perioral Area: None, normal Jaw: None, normal Tongue: None, normal,Extremity Movements Upper (arms, wrists, hands, fingers): None, normal Lower (legs, knees, ankles, toes): None, normal, Trunk Movements Neck, shoulders, hips: None, normal, Overall Severity Severity of abnormal movements (highest score from questions above): None, normal Incapacitation due to abnormal movements: None, normal Patient's awareness of abnormal movements (rate only patient's report): No Awareness, Dental Status Current problems with teeth and/or dentures?: No Does patient usually wear dentures?: No  CIWA:    COWS:     Musculoskeletal: Strength & Muscle Tone: within normal limits Gait & Station: normal Patient leans: N/A  Psychiatric Specialty Exam: Physical Exam  Constitutional: He appears well-developed and well-nourished.  HENT:  Head: Normocephalic and atraumatic.  Eyes: Pupils are equal, round, and reactive to light.  Neck: Normal range of motion. Neck supple.  Cardiovascular: Normal rate and regular rhythm.   Musculoskeletal: Normal range of motion.  Neurological: He is alert.  Skin: Skin is warm and dry.  Psychiatric:  As above    ROS  Blood pressure 118/61, pulse 90, temperature 97.8 F (36.6 C), temperature source Oral, resp. rate 16, height 5\' 7"  (1.702 m),  weight 55.3 kg (122 lb), SpO2 100 %.Body mass index is 19.11 kg/m.  General Appearance:  Playing card with a peer. Was engaging well prior to interview. During interview appeared anxious. Shuffling his feet throughout. Not internally distracted. Polite and engaged well.   Eye Contact:  Good  Speech:  Clear and Coherent and Normal Rate  Volume:  Normal  Mood:  Objectively anxious. Subjectively tells me he is always like that  Affect:  Congruent  Thought Process:  Goal Directed and Linear  Orientation:  Full (Time, Place, and Person)  Thought Content:  Future oriented. No delusional theme. No preoccupation with violent thoughts. No negative ruminations. No obsession.  No hallucination in any modality.   Suicidal Thoughts:  Intrusive thoughts are less. Denies any intent to act on them.  Homicidal Thoughts:  No  Memory:  Immediate;   Good Recent;   Good Remote;   Good  Judgement:  Fair  Insight:  Partial  Psychomotor Activity:  Restlessness  Concentration:  Concentration: Good and Attention Span: Good  Recall:  Good  Fund of Knowledge:  Good  Language:  Good  Akathisia:    Handed:    AIMS (if indicated):     Assets:  Communication Skills Desire for Improvement Housing Physical Health Vocational/Educational  ADL's:  Intact  Cognition:  WNL  Sleep:  Number of Hours: 6.5     Treatment Plan Summary: Patient is vague and has not allowed any collaborative information gathering. He is objectively anxious though he dismisses this as his baseline. Possibly related to substance use/cravings/withdrawals. We need further information in order to stratify his risk assessment. We plan to evaluate him further.   Psychiatric:  Medical:  Psychosocial:   PLAN: 1. Increase Lexapro to 10 mg daily 2. Encourage patient to take medications as prescribed 3. SW would gather collateral from his roomate and outpatient provider that referred him 4. Motivational enhancement 5. Continue to monitor  mood, behavior and interaction with peers   Georgiann Cocker, MD 11/08/2016, 11:49 AM

## 2016-11-08 NOTE — Progress Notes (Signed)
Pt attended the evening AA speaker meeting. Pt was engaged and appropriate. Ahjanae Cassel C, NT  11/08/16 9:41 PM 

## 2016-11-09 MED ORDER — NICOTINE 21 MG/24HR TD PT24: 21.0000 mg | MEDICATED_PATCH | Freq: Every day | TRANSDERMAL | 0 refills | Status: DC

## 2016-11-09 MED ORDER — TRAZODONE HCL 50 MG PO TABS: 50.0000 mg | ORAL_TABLET | Freq: Every evening | ORAL | 0 refills | Status: DC | PRN

## 2016-11-09 MED ORDER — ESCITALOPRAM OXALATE 10 MG PO TABS: 10.0000 mg | ORAL_TABLET | Freq: Every day | ORAL | 0 refills | Status: DC

## 2016-11-09 MED ORDER — HYDROXYZINE HCL 25 MG PO TABS: 25.0000 mg | ORAL_TABLET | Freq: Three times a day (TID) | ORAL | 0 refills | Status: DC | PRN

## 2016-11-09 NOTE — Discharge Summary (Signed)
Physician Discharge Summary Note  Patient:  Adrian Kirby is an 21 y.o., male MRN:  [id number] DOB:  12/12/1995 Patient phone:  [phone] (home)  Patient address:   [address] [place] [address],  Total Time spent with patient: Greater than 30 minutes  Date of Admission:  11/05/2016 Date of Discharge: 11-09-16  Reason for Admission: Suicidal ideations.  Principal Problem: Substance induced mood disorder [facility])  Discharge Diagnoses: Patient Active Problem List   Diagnosis Date Noted  . Substance induced mood disorder (HCC) [F19.94] 11/08/2016  . MDD (major depressive disorder) [F32.9] 11/05/2016   Past Psychiatric History: Major depressive disorder,   Past Medical History: History reviewed. No pertinent past medical history. History reviewed. No pertinent surgical history.  Family History: History reviewed. No pertinent family history.  Family Psychiatric  History: See H&P  Social History:  History  Alcohol Use  . Yes    Comment: "sometimes"     History  Drug Use  . Types: Marijuana    Social History   Social History  . Marital status: Single    Spouse name: N/A  . Number of children: N/A  . Years of education: N/A   Social History Main Topics  . Smoking status: Current Every Day Smoker    Packs/day: 1.50  . Smokeless tobacco: Current User    Types: Chew  . Alcohol use Yes     Comment: "sometimes"  . Drug use: Yes    Types: Marijuana  . Sexual activity: Not Asked   Other Topics Concern  . None   Social History Narrative  . None   Hospital Course: Adrian Kirby 21 yo Caucasian male, single, employed, lives with a roommate. History of SUD (THC, MDMA, Dextromethorphan), self mutilation and mood disorder. Self presented to the ER. Says he Kirby referred from his out-patient providers. Expressed suicidal thoughts at presentation. At reviewed on the unit stated that he Kirby not actually having any intent to act. Vague on his main stressors. Mentioned loss of a  relationship weeks earlier. Later stated that it Kirby many years ago.  After evaluation of his presenting symptoms, Adrian Kirby Kirby started on medication regimen for his presenting symptoms. He Kirby medicated & discharged on; Citalopram 20 mg for depression, Hydroxyzine 25 mg prn for anxiety, Nicotine patch 21 mg for smoking cessation & Trazodone 50 mg for insomnia. He Kirby also enrolled in the group counseling sessions being offered & held on this unit. He learned coping skills. He received no other medication regimen as he presented no other medical issues. He tolerated his treatment regimen without nay adverse effects reported.  Adrian Kirby symptoms respsonded well to his treatment regimen. This is evidenced by his reports of improved mood & absence of suicidal ideations or substance withdrawal symptoms. Adrian Kirby seen today by his attending psychiatrist. He states that he is feeling good. Not feeling depressed. No thoughts of suicide. Not subjectively anxious. Has been refusing his SSRI. Says he does not want to take any other medication other than his sleep pill. Wants to get back to work. No thoughts of violence. No thoughts of homicide. No craving for substances.   Staff reports that he has been pleasant. He has been interacting well with peers. No behavioral issue. Social Worker has completed a collateral with his roommate yesterday. The weapon at home is secured. Roommate reports that at baseline he looks anxious all the time. No safety concerns raised by his roommate.   Domanic's case Kirby discussed at the treatment team meeting this  am. The Team members feels that patient is back to his baseline level of function. Team agrees with plan to discharge patient today to continue mental health care on outpatient basis as noted below. Adrian Kirby Kirby provided with a 7 days worth, supply samples of his [facility] discharge medications. He left [facility] with all personal belongings in no apparent distress. Transportation per  friend.   Physical Findings: AIMS: Facial and Oral Movements Muscles of Facial Expression: None, normal Lips and Perioral Area: None, normal Jaw: None, normal Tongue: None, normal,Extremity Movements Upper (arms, wrists, hands, fingers): None, normal Lower (legs, knees, ankles, toes): None, normal, Trunk Movements Neck, shoulders, hips: None, normal, Overall Severity Severity of abnormal movements (highest score from questions above): None, normal Incapacitation due to abnormal movements: None, normal Patient's awareness of abnormal movements (rate only patient's report): No Awareness, Dental Status Current problems with teeth and/or dentures?: No Does patient usually wear dentures?: No  CIWA:  CIWA-Ar Total: 1 COWS:     Musculoskeletal: Strength & Muscle Tone: within normal limits Gait & Station: normal Patient leans: N/A  Psychiatric Specialty Exam: Physical Exam  Constitutional: He is oriented to person, place, and time. He appears well-developed.  HENT:  Head: Normocephalic.  Neck: Normal range of motion.  Cardiovascular: Normal rate.   Respiratory: Effort normal.  GI: Soft.  Genitourinary:  Genitourinary Comments: Deferred  Musculoskeletal: Normal range of motion.  Neurological: He is alert and oriented to person, place, and time.  Skin: Skin is warm.    Review of Systems  Constitutional: Negative.   HENT: Negative.   Eyes: Negative.   Respiratory: Negative.   Cardiovascular: Negative.   Gastrointestinal: Negative.   Genitourinary: Negative.   Musculoskeletal: Negative.   Skin: Negative.   Neurological: Negative.   Endo/Heme/Allergies: Negative.   Psychiatric/Behavioral: Positive for depression (Stable) and substance abuse (Hx. Cannabis use disorder). Negative for memory loss and suicidal ideas. The patient has insomnia (Stable). The patient is not nervous/anxious.     Blood pressure 118/61, pulse 90, temperature 97.8 F (36.6 C), temperature source Oral,  resp. rate 16, height 5\' 7"  (1.702 m), weight 55.3 kg (122 lb), SpO2 100 %.Body mass index is 19.11 kg/m.  See Md's SRA   Have you used any form of tobacco in the last 30 days? (Cigarettes, Smokeless Tobacco, Cigars, and/or Pipes): Yes  Has this patient used any form of tobacco in the last 30 days? (Cigarettes, Smokeless Tobacco, Cigars, and/or Pipes):Yes, provided with a nicotine patch 21 mg prescription for smoking cessation.  Blood Alcohol level:  Lab Results  Component Value Date   ETH <5 11/04/2016   Metabolic Disorder Labs:  No results found for: HGBA1C, MPG No results found for: PROLACTIN No results found for: CHOL, TRIG, HDL, CHOLHDL, VLDL, LDLCALC  See Psychiatric Specialty Exam and Suicide Risk Assessment completed by Attending Physician prior to discharge.  Discharge destination:  Home  Is patient on multiple antipsychotic therapies at discharge:  No   Has Patient had three or more failed trials of antipsychotic monotherapy by history:  No  Recommended Plan for Multiple Antipsychotic Therapies: NA  Allergies as of 11/09/2016   No Known Allergies     Medication List    STOP taking these medications   dicyclomine 20 MG tablet Commonly known as:  BENTYL   famotidine 20 MG tablet Commonly known as:  PEPCID     TAKE these medications     Indication  escitalopram 10 MG tablet Commonly known as:  LEXAPRO Take 1  tablet (10 mg total) by mouth daily. For depression Start taking on:  11/10/2016  Indication:  Major Depressive Disorder   hydrOXYzine 25 MG tablet Commonly known as:  ATARAX/VISTARIL Take 1 tablet (25 mg total) by mouth 3 (three) times daily as needed for anxiety.  Indication:  Anxiety Neurosis   nicotine 21 mg/24hr patch Commonly known as:  NICODERM CQ - dosed in mg/24 hours Place 1 patch (21 mg total) onto the skin daily. For smoking cessation Start taking on:  11/10/2016  Indication:  Nicotine Addiction   traZODone 50 MG tablet Commonly known  as:  DESYREL Take 1 tablet (50 mg total) by mouth at bedtime as needed for sleep.  Indication:  Trouble Sleeping      Follow-up Information    [facility] Follow up on 11/10/2016.   Why:  Hospital follow-up at 9:00AM. Please bring: Photo ID, Social [employer] Card, any proof of household income. Thank you.  Contact information: [address] [place] [address] [phone]          Follow-up recommendations: Activity:  As tolerated Diet: As recommended by your primary care doctor. Keep all scheduled follow-up appointments as recommended.   Comments: Patient is instructed prior to discharge to: Take all medications as prescribed by his/her mental healthcare provider. Report any adverse effects and or reactions from the medicines to his/her outpatient provider promptly. Patient has been instructed & cautioned: To not engage in alcohol and or illegal drug use while on prescription medicines. In the event of worsening symptoms, patient is instructed to call the crisis hotline, 911 and or go to the nearest ED for appropriate evaluation and treatment of symptoms. To follow-up with his/her primary care provider for your other medical issues, concerns and or health care needs.   Signed: Sanjuana KavaNwoko, Kevonta Phariss I, NP, PMHNP, FNP-BC 11/09/2016, 10:36 AM

## 2016-11-09 NOTE — Progress Notes (Signed)
Pt discharged home with his friend. Pt was ambulatory, stable and appreciative at that time. All papers and prescriptions were given and valuables returned. Verbal understanding expressed. Denies SI/HI and A/VH. Pt given opportunity to express concerns and ask questions.

## 2016-11-09 NOTE — Progress Notes (Signed)
Recreation Therapy Notes  Date: 11/09/16 Time: 0925 Location: 300 Hall Dayroom  Group Topic: Stress Management  Goal Area(s) Addresses:  Patient will verbalize importance of using healthy stress management.  Patient will identify positive emotions associated with healthy stress management.   Behavioral Response: Engaged  Intervention: Stress Management  Activity :  Progressive Muscle Relaxation.  LRT introduced the stress management technique of progressive muscle relaxation.  LRT read a script to guide patients through the transitions to relax each muscle individually.  Patients were to follow along as LRT read script to engage in activity.  Education:  Stress Management, Discharge Planning.   Education Outcome: Acknowledges edcuation/In group clarification offered/Needs additional education  Clinical Observations/Feedback: Pt attended group.   Caroll RancherMarjette Breyon Sigg, LRT/CTRS        Caroll RancherLindsay, Airon Sahni A 11/09/2016 11:26 AM

## 2016-11-09 NOTE — Progress Notes (Signed)
  [facility] Discharge Plan :  Will you be returning to the same living situation after discharge:  Yes,  home At discharge, do you have transportation home?: Yes,  friend will pick him up.  Do you have the ability to pay for your medications: Yes,  mental health  Release of information consent forms completed and submitted to medical records by CSW.  Patient to Follow up at: Follow-up Information    [facility] Follow up on 11/10/2016.   Why:  Hospital follow-up at 9:00AM. Please bring: Photo ID, Social [employer] Card, any proof of household income. Thank you.  Contact information: [address] [place] [id number] [phone]           Next level of care provider has access to [facility] Link:no  Safety Planning and Suicide Prevention discussed: Yes,  SPE completed with pt; pt declined to consent to family contact. SPI pamphlet and Mobile Crisis information provided to pt.   Have you used any form of tobacco in the last 30 days? (Cigarettes, Smokeless Tobacco, Cigars, and/or Pipes): Yes  Has patient been referred to the Quitline?: Patient refused referral  Patient has been referred for addiction treatment: Yes  Aracelia Brinson N Smart LCSW 11/09/2016, 10:29 AM

## 2016-11-09 NOTE — [facility] Suicide Risk Assessment (Signed)
[facility]   Principal Problem: Substance induced mood disorder [facility](HCC) Discharge Diagnoses:  Patient Active Problem List   Diagnosis Date Noted  . Substance induced mood disorder (HCC) [F19.94] 11/08/2016  . MDD (major depressive disorder) [F32.9] 11/05/2016    Total Time spent with patient: 45 minutes  Musculoskeletal: Strength & Muscle Tone: within normal limits Gait & Station: normal Patient leans: N/A  Psychiatric Specialty Exam: Review of Systems  Constitutional: Negative.   HENT: Negative.   Eyes: Negative.   Respiratory: Negative.   Cardiovascular: Negative.   Gastrointestinal: Negative.   Genitourinary: Negative.   Musculoskeletal: Negative.   Skin: Negative.   Neurological: Negative.   Endo/Heme/Allergies: Negative.   Psychiatric/Behavioral: Negative for depression, hallucinations, memory loss, substance abuse and suicidal ideas. The patient is not nervous/anxious and does not have insomnia.     Blood pressure 118/61, pulse 90, temperature 97.8 F (36.6 C), temperature source Oral, resp. rate 16, height 5\' 7"  (1.702 m), weight 55.3 kg (122 lb), SpO2 100 %.Body mass index is 19.11 kg/m.  General Appearance: Neatly dressed, pleasant, engaging well and cooperative. Appropriate behavior. Not in any distress. Good relatedness. Not internally stimulated  Eye Contact::  Good  Speech:  Spontaneous, normal prosody. Normal tone and rate.   Volume:  Normal  Mood:  Euthymic  Affect:  Appropriate  Thought Process:  Linear  Orientation:  Full (Time, Place, and Person)  Thought Content:  Future oriented. No delusional theme. No preoccupation with violent thoughts. No negative ruminations. No obsession.  No hallucination in any modality.    Suicidal Thoughts:  No  Homicidal Thoughts:  No  Memory:  Immediate;   Good Recent;   Good Remote;   Good  Judgement:  Good  Insight:  Fair  Psychomotor Activity:  Normal  Concentration:  Good  Recall:  Good   Fund of Knowledge:Good  Language: Good  Akathisia:  No  Handed:    AIMS (if indicated):     Assets:  Engineer, maintenanceCommunication Skills Housing Physical Health Social Support Talents/Skills Transportation Vocational/Educational  Sleep:  Number of Hours: 5.25  Cognition: WNL  ADL's:  Intact   Clinical Assessment::   21 yo Caucasian male, single, employed, lives with a roommate. History of SUD (THC, MDMA, Dextromethorphan), self mutilation and mood disorder. Self presented to the ER. Says he was referred from his out-patient providers. Expressed suicidal thoughts at presentation. At reviewed on the unit stated that he was not actually having any intent to act. Vague on his main stressors. Mentioned loss of a relationship weeks earlier. Later stated that it was many years ago.  Seen today. States that he is feeling good. Not feeling depressed. No thoughts of suicide. Not subjectively anxious. Has been refusing his SSRI. Says he does not want to take any other medication other than his sleep pill. Wants to get back to work. No thoughts of violence. No thoughts of homicide. No craving for substances.   Staff reports that he has been pleasant. He has been interacting well with peers. No behavioral issue. SW completed a collateral with his roommate yesterday. The weapon at home is secured. Roommate reports that at baseline he looks anxious all the time. No safety concerns raised by his roommate.   Patient was discussed at team. Team members feels that patient is back to his baseline level of function. Team agrees with plan to discharge patient today.   Demographic Factors:  Male and Caucasian  Loss Factors: NA  Historical Factors: Impulsivity  Risk Reduction Factors:   Employed, Living with another person, especially a relative, Positive social support, Positive therapeutic relationship and Positive coping skills or problem solving skills  Continued Clinical Symptoms:  As above  Cognitive  Features That Contribute To Risk:  None    Suicide Risk:  Minimal: No identifiable suicidal ideation.  Patient is not having any thoughts of suicide at this time. Modifiable risk factors targeted during this admission includes depression and substance use. Demographical and historical risk factors cannot be modified. Patient is now engaging well. Patient is reliable and is future oriented. We have buffered patient's support structures. At this point, patient is at low risk of suicide. Patient is aware of the effects of psychoactive substances on decision making process. Patient has been provided with emergency contacts. Patient acknowledges to use resources provided if unforseen circumstances changes their current risk stratification.    Follow-up Information    [facility] Follow up on 11/10/2016.   Why:  Hospital follow-up at 9:00AM. Please bring: Photo ID, Social [employer], any proof of household income. Thank you.  Contact information: [address] [place] [address] [phone]           Plan Of Care/Follow-up recommendations:  1. Continue current psychotropic medications 2. Mental health and addiction follow up as arranged.  3. Discharge in care of his roomate 4. Provided limited quantity of prescriptions   Georgiann Cocker, MD 11/09/2016, 10:08 AM

## 2016-11-09 NOTE — BHH Group Notes (Signed)
Adult Psychoeducational Group Note  Date:  11/09/2016 Time:  1015 Group Topic/Focus:  Crisis Planning:   The purpose of this group is to help patients create a crisis plan for use upon discharge or in the future, as needed.  Participation Level:  Active  Participation Quality:  Appropriate, Attentive and Sharing  Affect:  Anxious and Appropriate  Cognitive:  Alert and Appropriate  Insight: Appropriate and Good  Engagement in Group:  Developing/Improving and Engaged  Modes of Intervention:  Discussion and Education  Additional Comments:  Pt was active in group. Pt was able to state a friend who he calls when he starts to not feel well. Pt states this will be part of his crisis plan.  Adrian Kirby, Adrian Kirby Aileen 11/09/2016, 10:57 AM

## 2017-08-04 IMAGING — CT CT ABD-PELV W/ CM
2 of 4 series · 16 of 46 positions shown, 18 images · IV contrast (iopamidol)
Comparison: None.

CLINICAL DATA: Patient with mid and lower abdominal pain for 6
days. Diarrhea.

EXAM:
CT ABDOMEN AND PELVIS WITH CONTRAST
TECHNIQUE: Multidetector CT imaging of the abdomen and pelvis was performed
using the standard protocol following bolus administration of
intravenous contrast.
CONTRAST:  30mL SPRMWX-H22 IOPAMIDOL (SPRMWX-H22) INJECTION 61%,
100mL SPRMWX-H22 IOPAMIDOL (SPRMWX-H22) INJECTION 61%

[Series 2: axial st · axial · 0.68mm/px · z∈[-411,-6]mm · 13 of 89 slices shown, 15 images]
[im 4/89  soft-tissue]
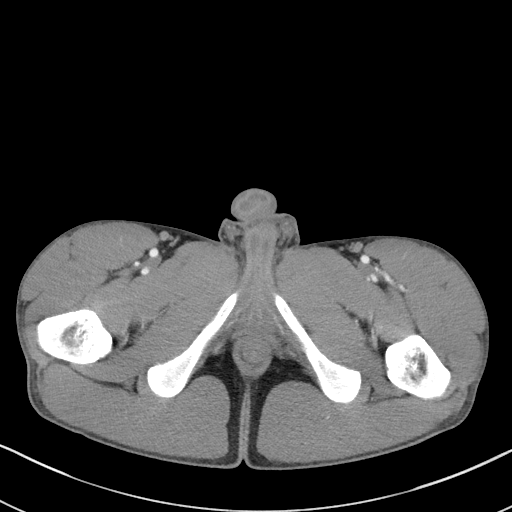
[im 4/89  bone]
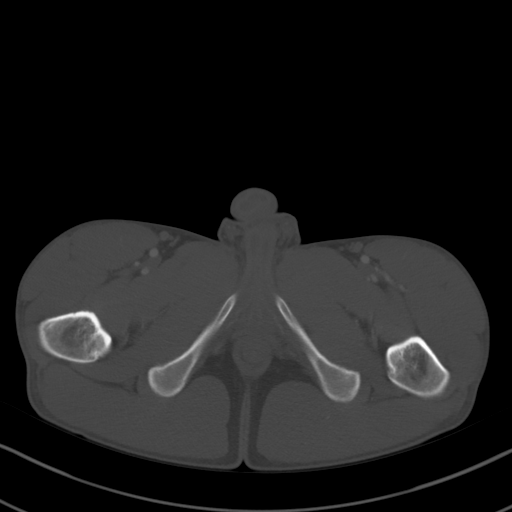
[im 11/89  soft-tissue]
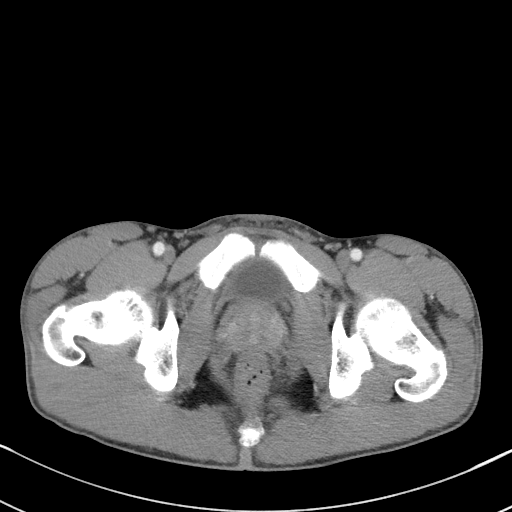
[im 17/89  soft-tissue]
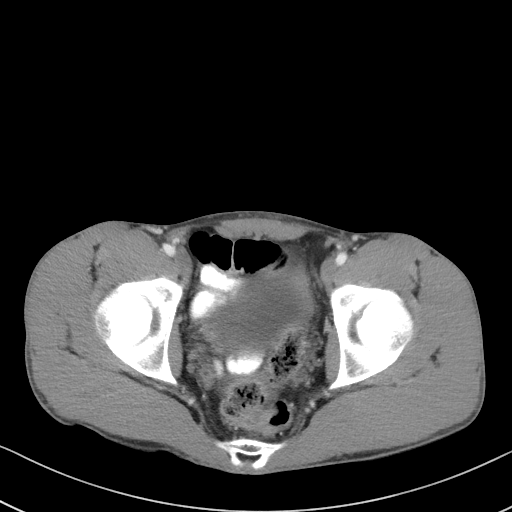
[im 24/89  soft-tissue]
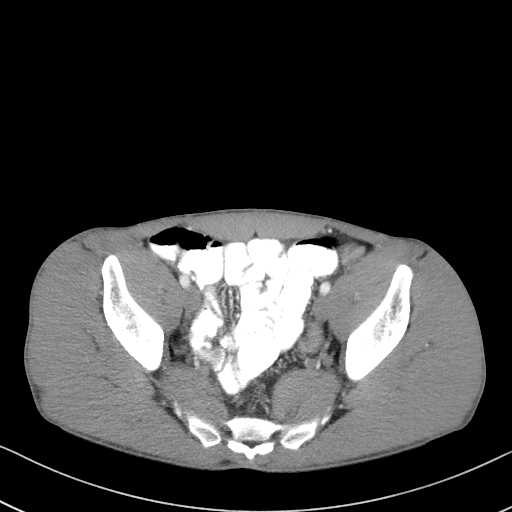
[im 31/89  soft-tissue]
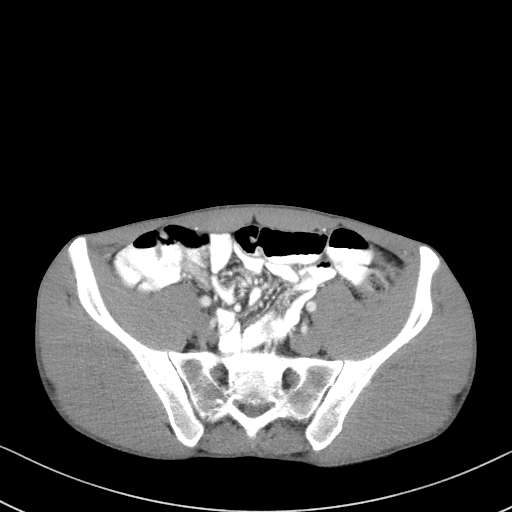
[im 38/89  soft-tissue]
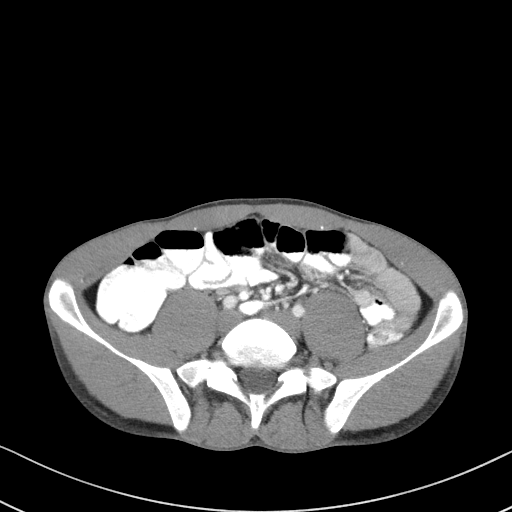
[im 45/89  soft-tissue]
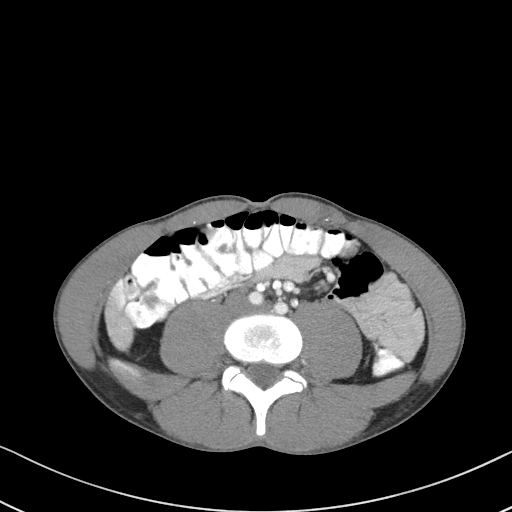
[im 51/89  soft-tissue]
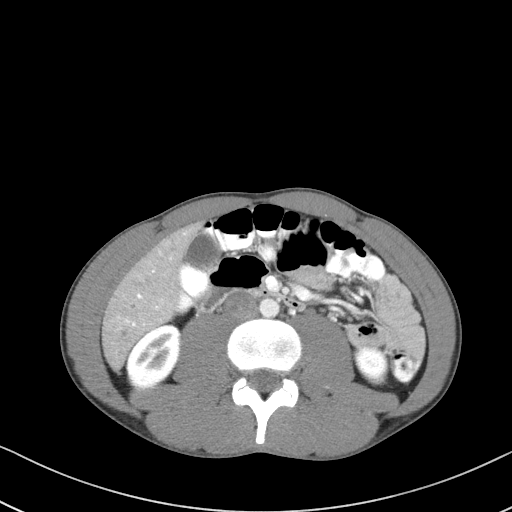
[im 58/89  soft-tissue]
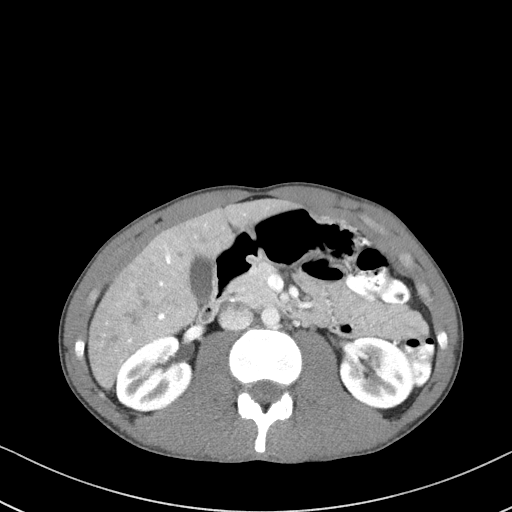
[im 58/89  bone]
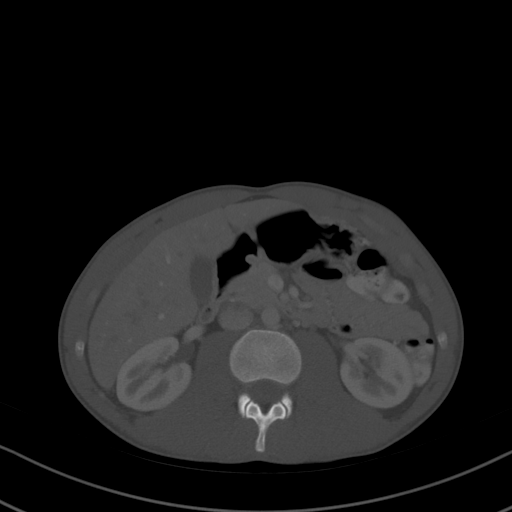
[im 65/89  soft-tissue]
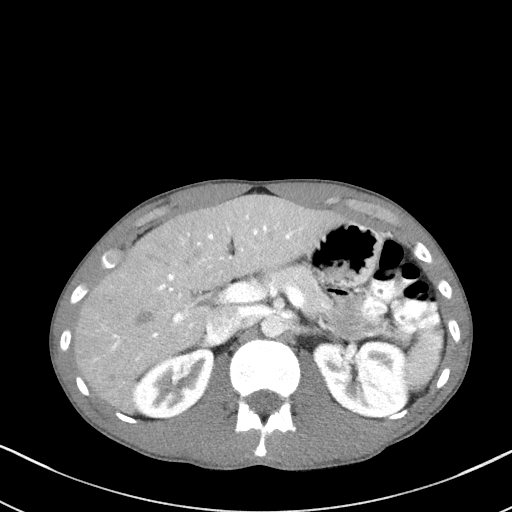
[im 72/89  soft-tissue]
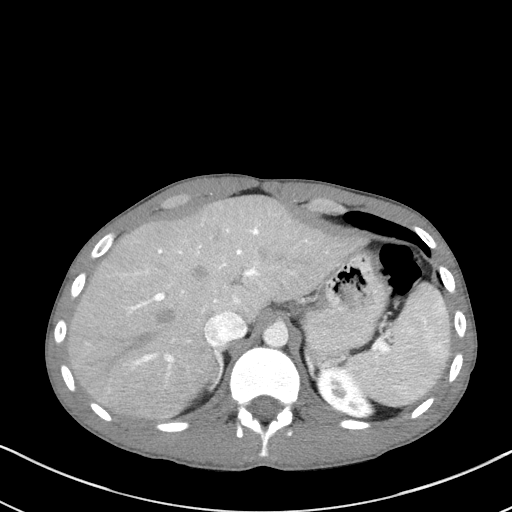
[im 78/89  soft-tissue]
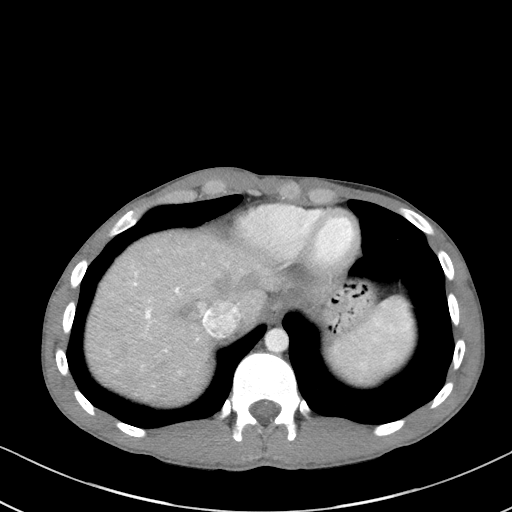
[im 85/89  soft-tissue]
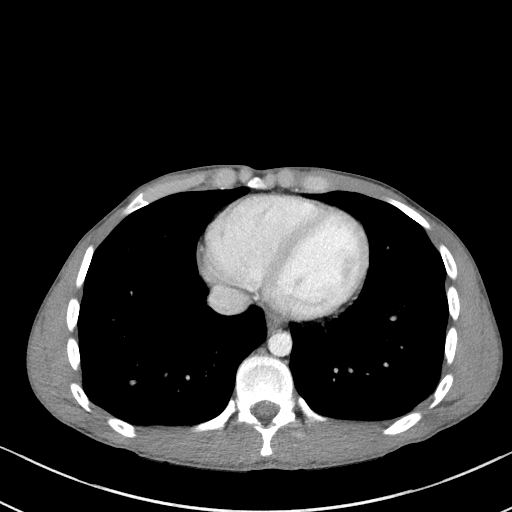

[Series 4: coronal st · coronal · 0.63mm/px · 3 of 69 slices shown]
[im 23/69  soft-tissue]
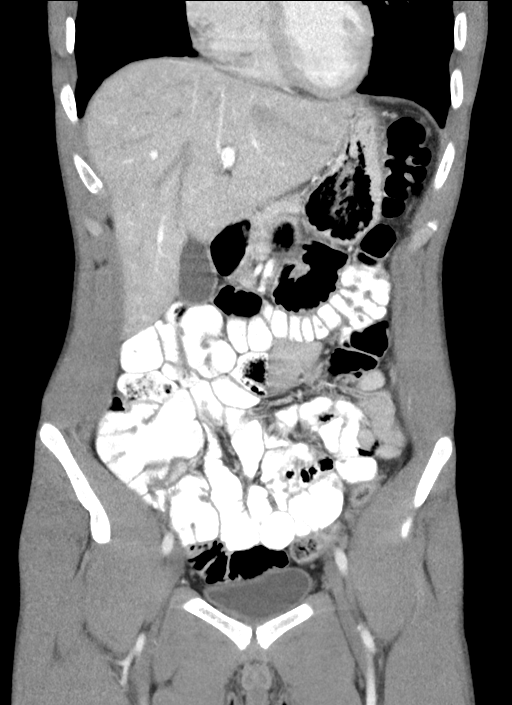
[im 31/69  soft-tissue]
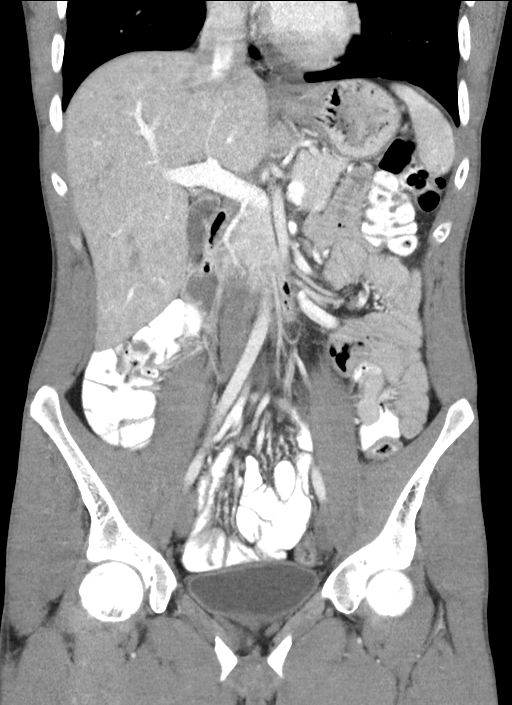
[im 38/69  soft-tissue]
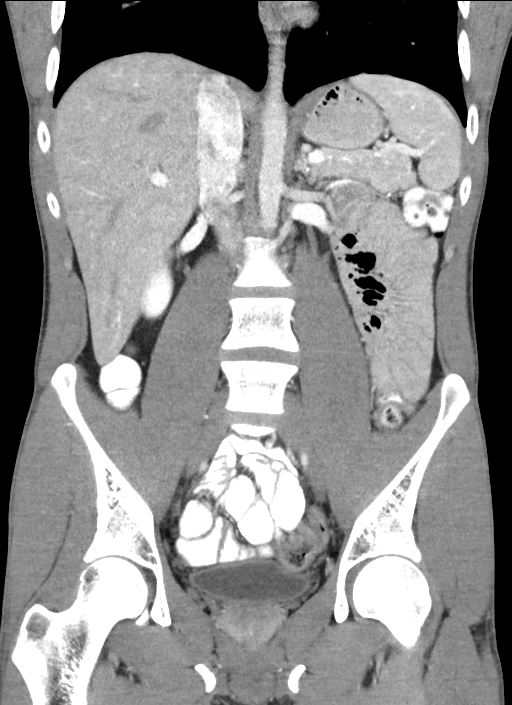

[16 of 46 positions shown; findings below may reference images not displayed]

FINDINGS: Lower chest: Normal heart size. No consolidative pulmonary
opacities. No pleural effusion.

Hepatobiliary: Liver is normal in size and contour. No focal hepatic
lesion is identified. Gallbladder is unremarkable.

Pancreas: Unremarkable

Spleen: Unremarkable

Adrenals/Urinary Tract: The adrenal glands are normal. Kidneys
enhance symmetrically with contrast. No hydronephrosis.

Stomach/Bowel: Oral contrast material throughout the majority of the
small large bowel. No evidence for bowel obstruction. No free fluid
or free intraperitoneal air. Normal morphology of the stomach.

Vascular/Lymphatic: Normal caliber abdominal aorta. No
retroperitoneal lymphadenopathy.

Reproductive: Prostate unremarkable.

Other: None.

Musculoskeletal: No aggressive or acute appearing osseous lesions.
IMPRESSION: No acute process within the abdomen or pelvis.

## 2022-06-13 NOTE — Progress Notes (Incomplete)
[facility] OF [place]  ASSESSMENT / PLAN: 26 y.o. male with *** - ***  CHIEF COMPLAINT: ***  HISTORY OF PRESENT ILLNESS: Adrian Kirby is a 26 y.o. male ***  VASCULAR SURGICAL HISTORY: ***  VASCULAR RISK FACTORS: {FINDINGS; POSITIVE NEGATIVE:814-070-3991} history of stroke / transient ischemic attack. {FINDINGS; POSITIVE NEGATIVE:814-070-3991} history of coronary artery disease. *** history of PCI. *** history of CABG.  {FINDINGS; POSITIVE NEGATIVE:814-070-3991} history of diabetes mellitus. Last A1c ***. {FINDINGS; POSITIVE NEGATIVE:814-070-3991} history of smoking. *** actively smoking. {FINDINGS; POSITIVE NEGATIVE:814-070-3991} history of hypertension. *** drug regimen with *** control. {FINDINGS; POSITIVE NEGATIVE:814-070-3991} history of chronic kidney disease.  Last GFR ***. CKD {stage:30421363}. {FINDINGS; POSITIVE NEGATIVE:814-070-3991} history of chronic obstructive pulmonary disease, treated with ***.  FUNCTIONAL STATUS: ECOG performance status: {findings; ecog performance status:31780} Ambulatory status: {TNHAmbulation:25868}  [name] 1 AND 3 YEAR INDEX Male (2pts) 75-79 or 80-84 (2pts) >84 (3pts) Dependence in toileting (1pt) Partial or full dependence in dressing (1pt) History of malignant neoplasm (2pts) CHF (3pts) COPD (1pts) CKD (3pts)  0-3 pts 6% 1 year mortality ; 21% 3 year mortality 4-5 pts 12% 1 year mortality ; 36% 3 year mortality >5 pts 21% 1 year mortality; 54% 3 year mortality   No past medical history on file.  No past surgical history on file.  No family history on file.  Social History   Socioeconomic History   Marital status: Single    Spouse name: Not on file   Number of children: Not on file   Years of education: Not on file   Highest education level: Not on file  Occupational History   Not on file  Tobacco Use   Smoking status: Every Day    Packs/day: 1.50    Types: Cigarettes   Smokeless tobacco: Current     Types: Chew  Vaping Use   Vaping Use: Every day  Substance and Sexual Activity   Alcohol use: Yes    Comment: "sometimes"   Drug use: Yes    Types: Marijuana   Sexual activity: Not on file  Other Topics Concern   Not on file  Social History Narrative   Not on file   Social Determinants of Health   Financial Resource Strain: Not on file  Food Insecurity: Not on file  Transportation Needs: Not on file  Physical Activity: Not on file  Stress: Not on file  Social Connections: Not on file  Intimate Partner Violence: Not on file    No Known Allergies  Current Outpatient Medications  Medication Sig Dispense Refill   escitalopram (LEXAPRO) 10 MG tablet Take 1 tablet (10 mg total) by mouth daily. For depression 30 tablet 0   hydrOXYzine (ATARAX/VISTARIL) 25 MG tablet Take 1 tablet (25 mg total) by mouth 3 (three) times daily as needed for anxiety. 75 tablet 0   nicotine (NICODERM CQ - DOSED IN MG/24 HOURS) 21 mg/24hr patch Place 1 patch (21 mg total) onto the skin daily. For smoking cessation 28 patch 0   traZODone (DESYREL) 50 MG tablet Take 1 tablet (50 mg total) by mouth at bedtime as needed for sleep. 30 tablet 0   No current facility-administered medications for this visit.    PHYSICAL EXAM There were no vitals filed for this visit.  Constitutional: *** appearing. *** distress. Appears *** nourished.  Neurologic: CN ***. *** focal findings. *** sensory loss. Psychiatric: *** Mood and affect symmetric and appropriate. Eyes: *** No icterus. No conjunctival pallor. Ears, nose, throat: *** mucous membranes moist.  Midline trachea.  Cardiac: *** rate and rhythm.  Respiratory: *** unlabored. Abdominal: *** soft, non-tender, non-distended.  Peripheral vascular: *** Extremity: *** edema. *** cyanosis. *** pallor.  Skin: *** gangrene. *** ulceration.  Lymphatic: *** Stemmer's sign. *** palpable lymphadenopathy.    PERTINENT LABORATORY AND RADIOLOGIC DATA  Most recent  CBC    Latest Ref Rng & Units 11/04/2016    7:12 PM 08/07/2016    4:22 PM  CBC  WBC 4.0 - 10.5 K/uL 9.2  7.4   Hemoglobin 13.0 - 17.0 g/dL 51.0  25.8   Hematocrit 39.0 - 52.0 % 47.0  49.0   Platelets 150 - 400 K/uL 217  249      Most recent CMP    Latest Ref Rng & Units 11/04/2016    7:12 PM 08/07/2016    4:22 PM  CMP  Glucose 65 - 99 mg/dL 81  527   BUN 6 - 20 mg/dL 8  11   Creatinine 7.82 - 1.24 mg/dL 4.23  5.36   Sodium 144 - 145 mmol/L 138  138   Potassium 3.5 - 5.1 mmol/L 3.8  4.6   Chloride 101 - 111 mmol/L 104  102   CO2 22 - 32 mmol/L 28  31   Calcium 8.9 - 10.3 mg/dL 9.5  9.8   Total Protein 6.5 - 8.1 g/dL 7.5  7.4   Total Bilirubin 0.3 - 1.2 mg/dL 0.6  0.8   Alkaline Phos 38 - 126 U/L 78  91   AST 15 - 41 U/L 20  19   ALT 17 - 63 U/L 13  22     Renal function CrCl cannot be calculated (Patient's most recent lab result is older than the maximum 21 days allowed.).  No results found for: "HGBA1C"  No results found for: "LDLCALC", "LDLC", "HIRISKLDL", "POCLDL", "LDLDIRECT", "REALLDLC", "TOTLDLC"   Vascular Imaging: ***  Alletta Mattos N. Lenell Antu, MD [facility] Phone Number: [phone] 06/13/2022 5:07 PM   Total time spent on preparing this encounter including chart review, data review, collecting history, examining the patient, coordinating care for this {tnhtimebilling:26202}  Portions of this report may have been transcribed using voice recognition software.  Every effort has been made to ensure accuracy; however, inadvertent computerized transcription errors may still be present.

## 2022-06-14 ENCOUNTER — Encounter: Payer: Self-pay | Admitting: Vascular Surgery

## 2022-06-22 ENCOUNTER — Encounter: Payer: Self-pay | Admitting: Vascular Surgery

## 2022-06-22 ENCOUNTER — Ambulatory Visit: Payer: BC Managed Care – PPO | Admitting: Vascular Surgery

## 2022-06-22 VITALS — BP 112/67 | HR 57 | Temp 98.1°F | Resp 20 | Ht 67.0 in | Wt 130.0 lb

## 2022-06-22 DIAGNOSIS — K551 Chronic vascular disorders of intestine: Secondary | ICD-10-CM

## 2022-06-22 NOTE — Progress Notes (Signed)
Patient ID: Adrian Kirby, male   DOB: [birth year], 27 y.o.   MRN: [id number]  Reason for Consult: No chief complaint on file.   Referred by Janine Limbo, PA-C  Subjective:     HPI:  Adrian Kirby is a 27 y.o. male without previous medical history.  He states that for many years he has had pain particularly with eating and also has had abnormal bowel movements without having had a solid bowel movement in multiple years.  He also has associated indigestion and pain with eating. 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  When he was weighing more patient states that he did not have asthmatic symptoms but also was alcoholic at the time which may have masked some of the symptoms. He states that he tends to take calories and by drinking especially smoothies and milkshakes but really likes to eat and this is quite problematic for him.  He is a current every day smoker.  No previous abdominal surgeries.  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  Weight has been stable recently.  No past medical history on file.  No family history on file.  No past surgical history on file.  Short Social History:  Social History   Tobacco Use   Smoking status: Every Day    Packs/day: 1.50    Types: Cigarettes   Smokeless tobacco: Current    Types: Chew  Substance Use Topics   Alcohol use: Yes    Comment: "sometimes"    No Known Allergies  Current Outpatient Medications  Medication Sig Dispense Refill   escitalopram (LEXAPRO) 10 MG tablet Take 1 tablet (10 mg total) by mouth daily. For depression 30 tablet 0   hydrOXYzine (ATARAX/VISTARIL) 25 MG tablet Take 1 tablet (25 mg total) by mouth 3 (three) times daily as needed for anxiety. 75 tablet 0   nicotine (NICODERM CQ - DOSED IN MG/24 HOURS) 21 mg/24hr patch Place 1 patch (21 mg total) onto the skin daily. For smoking cessation 28 patch 0    traZODone (DESYREL) 50 MG tablet Take 1 tablet (50 mg total) by mouth at bedtime as needed for sleep. 30 tablet 0   No current facility-administered medications for this visit.    Review of Systems  Constitutional:  Constitutional negative. HENT: HENT negative.  Eyes: Eyes negative.  Respiratory: Respiratory negative.  Cardiovascular: Cardiovascular negative.  GI: Positive for abdominal pain.  Musculoskeletal: Musculoskeletal negative.  Skin: Skin negative.  Neurological: Neurological negative. Hematologic: Hematologic/lymphatic negative.  Psychiatric: Psychiatric negative.        Objective:  Objective  Vitals:   06/22/22 1219  BP: 112/67  Pulse: (!) 57  Resp: 20  Temp: 98.1 F (36.7 C)  SpO2: 98%     Physical Exam HENT:     Head: Normocephalic.     Nose: Nose normal.  Eyes:     Pupils: Pupils are equal, round, and reactive to light.  Cardiovascular:     Pulses: Normal pulses.  Pulmonary:     Effort: Pulmonary effort is normal.  Abdominal:     General: Abdomen is flat.  Musculoskeletal:        General: Normal range of motion.     Cervical back: Normal range of motion.     Right lower leg: No edema.     Left lower leg: No edema.  Skin:    General: Skin  is warm.     Capillary Refill: Capillary refill takes less than 2 seconds.  Neurological:     General: No focal deficit present.     Mental Status: He is alert.  Psychiatric:        Mood and Affect: Mood normal.        Behavior: Behavior normal.        Thought Content: Thought content normal.        Judgment: Judgment normal.    Data: CT IMPRESSION: 1. No acute abnormality identified in the abdomen or pelvis. 2. Focal dilation of the proximal duodenum measuring up to 3.8 cm with abrupt narrowing posterior to the SMA and SMV measuring 4 mm in diameter at this location, 2018 the proximal duodenal measured 2.5 cm with the duodenum posterior to the SMA/SMV measuring 3 mm. Findings are nonspecific but can  be seen in the setting of SMA syndrome. 3. Moderate volume of formed stool in the colon.      Assessment/Plan:   27 year old male with CT evidence of SMA syndrome and also does have some symptoms that coincide such as abdominal pain and mild fear of eating due to discomfort as well as associated indigestion.  I recommended milkshakes and increased calories to see if he can gain weight and possibly relieve his symptoms and we will also refer to general surgery for further recommendations.  All of his questions were answered he can see me on an as-needed basis.  Waynetta Sandy MD [facility] Specialists of [facility]

## 2022-08-10 ENCOUNTER — Telehealth: Payer: Self-pay | Admitting: Gastroenterology

## 2022-08-10 NOTE — Telephone Encounter (Signed)
Dr. Rush Landmark,  Urgent referral in [place] from [place] for possible SMA syndrome and to rule out mass lesion.  Records in EPIC.  Please review and advise scheduling.  Thanks  Dr. Rush Landmark supervising MD 08/10/2022

## 2022-08-12 NOTE — Telephone Encounter (Signed)
Adrian Kirby this should go to the DOD for that day not the supervising.  Thank you

## 2022-08-12 NOTE — Telephone Encounter (Signed)
Unfortunately, I am the Hospital MD this week, will review when I have a chance, this week or early next week. GM

## 2022-08-12 NOTE — Telephone Encounter (Signed)
Dr. Lorenso Courier,  Can you please review this urgent referral and advise scheduling?  Dr. Lorenso Courier DOD 08/12/2022

## 2022-08-15 ENCOUNTER — Encounter: Payer: Self-pay | Admitting: Gastroenterology

## 2022-08-15 NOTE — Telephone Encounter (Signed)
Adrian Kirby,  I spoke with patient and he is aware of this appointment.  Mailed paperwork

## 2022-08-16 NOTE — Telephone Encounter (Signed)
RG, Let SA and I know what you decide to do. GM

## 2022-08-17 NOTE — Telephone Encounter (Signed)
[place] he would require enteroscopy at [facility]. Not a candidate for Endo unit ([facility]) procedure.  It would be best to admit- then do enteroscopy first, followed by feeding tube placement (by [facility] team).  Just let me know when you plan to admit. Either I or our inpt team would be available to help in any way. RG

## 2022-08-18 NOTE — Telephone Encounter (Signed)
Sounds good everyone. SA, I am the hospital main doctor at [facility] on the week of March 11.  If you end up bringing the patient in for admission to [facility], I can be available to do his endoscopy/enteroscopy attempt.  That may save him from needing to actually be seen in clinic since really which need is an enteroscopy. If that works out just let me know what date you are thinking about coordinating it so that I can save a slot for him tentatively the day of admission versus the next day. If that particular week does not work, then I am sure as Dr. Lyndel Safe has outlined, just reach out back to [place] and we can help coordinate with whichever of the [place] MDs is on to work on performing that enteroscopy. Thanks. GM

## 2022-08-24 NOTE — Telephone Encounter (Signed)
SA, Thanks for the update. Putting my endoscopy team for Adrian Kirby to be aware that we will need an enteroscopy slot for Monday or Tuesday pending when the patient is admitted and anesthesia availability. Thanks. GM  Endo team, This patient will be admitted hopefully on Sunday and our team will see in consultation for planned enteroscopy and potential feeding tube placement. Thanks. GM

## 2022-08-31 ENCOUNTER — Ambulatory Visit: Payer: BC Managed Care – PPO | Admitting: Gastroenterology

## 2022-08-31 ENCOUNTER — Encounter: Payer: Self-pay | Admitting: Gastroenterology

## 2022-08-31 VITALS — BP 116/68 | HR 56 | Ht 67.0 in | Wt 129.0 lb

## 2022-08-31 DIAGNOSIS — K551 Chronic vascular disorders of intestine: Secondary | ICD-10-CM

## 2022-08-31 DIAGNOSIS — R1084 Generalized abdominal pain: Secondary | ICD-10-CM | POA: Diagnosis not present

## 2022-08-31 DIAGNOSIS — R109 Unspecified abdominal pain: Secondary | ICD-10-CM

## 2022-08-31 DIAGNOSIS — R140 Abdominal distension (gaseous): Secondary | ICD-10-CM | POA: Diagnosis not present

## 2022-08-31 MED ORDER — OMEPRAZOLE 20 MG PO CPDR: 20.0000 mg | DELAYED_RELEASE_CAPSULE | Freq: Every day | ORAL | 3 refills | Status: AC

## 2022-08-31 NOTE — Patient Instructions (Addendum)
_______________________________________________________  If your blood pressure at your visit was 140/90 or greater, please contact your primary care physician to follow up on this.  _______________________________________________________  If you are age 28 or older, your body mass index should be between 23-30. Your Body mass index is 20.2 kg/m. If this is out of the aforementioned range listed, please consider follow up with your Primary Care Provider.  If you are age 36 or younger, your body mass index should be between 19-25. Your Body mass index is 20.2 kg/m. If this is out of the aformentioned range listed, please consider follow up with your Primary Care Provider.   ________________________________________________________  The [facility] providers would like to encourage you to use [facility] to communicate with providers for non-urgent requests or questions.  Due to long hold times on the telephone, sending your provider a message by [facility] may be a faster and more efficient way to get a response.  Please allow 48 business hours for a response.  Please remember that this is for non-urgent requests.  _______________________________________________________  Adrian Kirby have been scheduled for an endoscopy. Please follow written instructions given to you at your visit today. If you use inhalers (even only as needed), please bring them with you on the day of your procedure.  We have sent the following medications to your pharmacy for you to pick up at your convenience: Omeprazole  Avoid NSAIDS  Thank you,  Dr. Jackquline Denmark

## 2022-08-31 NOTE — Progress Notes (Signed)
Chief Complaint: For GI evaluation  Referring Provider:  Dwan Bolt, MD      ASSESSMENT AND PLAN;   #1. SMA syndrome.  On equate nutritional shakes 12/day.  Maintaining weight. Nl Alb  #2. IBS-C   Plan: -Enteroscopy @ [facility]. Clear liq 24hr prior. -Omeprazole '20mg'$  po QD (capsules, open capsule-do not chew). -Avoid NSAIDs -CBC, CMP @ time of enteroscopy. -Cut down and stop marijuana (avoid complicating current clinical picture)   HPI:    Adrian Kirby is a 27 y.o. male  With Anxiety, GERD, H/O Substance abuse (smokes weed, quit ETOH 09/2019  With SMA syndrome on CT, confirmed with upper GI series Seen by Dr. Michaelle Birks Advised to get enteroscopy to rule out duodenal mucosal lesions prior to tube feeds/surgery.   He has been having postprandial nausea, regurgitation, heartburn x several years, worst since December 2023 Evaluated in ED 12/20, Nl CBC, CMP (alb 4.3), lipase. CT scan Abdo/pelvis showed SMA syndrome.  He has been seen by vascular surgery and Dr. Michaelle Birks.  Underwent upper GI series which confirmed partial obstruction in the third portion of duodenum.  He is currently on 12/day equate nutritional shakes and is maintaining his weight.  He has not eaten anything solid for the last 2 months.  He does have alternating diarrhea and constipation.  Continues to use marijuana.  He has quit all alcohol and other drugs since April 2021.  No history of IV drug use.    Wt Readings from Last 3 Encounters:  08/31/22 129 lb (58.5 kg)  06/22/22 130 lb (59 kg)  11/04/16 125 lb (56.7 kg)    Past GI workup: UGI series 07/29/2022  1. Findings consistent with partial obstruction at the level of the transverse duodenum, favoring SMA syndrome. 2. No other explanation for patient's symptoms. Upper normal small bowel caliber throughout the abdomen and pelvis, without focal transition point to suggest obstruction.  CT Abdo/pelvis with contrast 06/16/2022 1. No  acute abnormality identified in the abdomen or pelvis.  2. Focal dilation of the proximal duodenum measuring up to 3.8 cm  with abrupt narrowing posterior to the SMA and SMV measuring 4 mm in  diameter at this location, 2018 the proximal duodenal measured 2.5  cm with the duodenum posterior to the SMA/SMV measuring 3 mm.  Findings are nonspecific but can be seen in the setting of SMA  syndrome.  3. Moderate volume of formed stool in the colon.   Review of labs from 06/08/2022: Normal CBC.  Normal CMP with albumin 4.3, BUN/creatinine 14/0.82.  Normal lipase at 23 Past Medical History:  Diagnosis Date   Anxiety    Depression    PTSD (post-traumatic stress disorder)     No past surgical history on file.  Family History  Problem Relation Age of Onset   Skin cancer Paternal Grandfather    Liver disease Neg Hx    Esophageal cancer Neg Hx    Colon cancer Neg Hx     Social History   Tobacco Use   Smoking status: Every Day    Packs/day: 1.00    Types: Cigarettes   Smokeless tobacco: Current    Types: Chew  Vaping Use   Vaping Use: Never used  Substance Use Topics   Alcohol use: Yes    Comment: "sometimes"   Drug use: Yes    Types: Marijuana    No current outpatient medications on file.   No current facility-administered medications for this visit.    No  Known Allergies  Review of Systems:  Constitutional: Denies fever, chills, diaphoresis, appetite change and fatigue.  HEENT: Denies photophobia, eye pain, redness, hearing loss, ear pain, congestion, sore throat, rhinorrhea, sneezing, mouth sores, neck pain, neck stiffness and tinnitus.   Respiratory: Denies SOB, DOE, cough, chest tightness,  and wheezing.   Cardiovascular: Denies chest pain, palpitations and leg swelling.  Genitourinary: Denies dysuria, urgency, frequency, hematuria, flank pain and difficulty urinating.  Musculoskeletal: Denies myalgias, back pain, joint swelling, arthralgias and gait problem.  Skin:  No rash.  Neurological: Denies dizziness, seizures, syncope, weakness, light-headedness, numbness and headaches.  Hematological: Denies adenopathy. Easy bruising, personal or family bleeding history  Psychiatric/Behavioral: Has anxiety or depression     Physical Exam:    BP 116/68   Pulse (!) 56   Ht '5\' 7"'$  (1.702 m)   Wt 129 lb (58.5 kg)   SpO2 97%   BMI 20.20 kg/m  Wt Readings from Last 3 Encounters:  08/31/22 129 lb (58.5 kg)  06/22/22 130 lb (59 kg)  11/04/16 125 lb (56.7 kg)   Constitutional: Thin built male., in no acute distress.  Anxious Psychiatric: Normal mood and affect. Behavior is normal. HEENT: Conjunctivae are normal. No scleral icterus.  Cardiovascular: Normal rate, regular rhythm. No edema Pulmonary/chest: Effort normal and breath sounds-bilateral decreased breath sounds.  No wheezing, rales or rhonchi. Abdominal: Soft, nondistended. Nontender. Bowel sounds active throughout. There are no masses palpable. No hepatomegaly. Rectal: Deferred Neurological: Alert and oriented to person place and time. Skin: Skin is warm and dry. No rashes noted.  Data Reviewed: I have personally reviewed following labs and imaging studies  CBC:    Latest Ref Rng & Units 11/04/2016    7:12 PM 08/07/2016    4:22 PM  CBC  WBC 4.0 - 10.5 K/uL 9.2  7.4   Hemoglobin 13.0 - 17.0 g/dL 16.6  16.9   Hematocrit 39.0 - 52.0 % 47.0  49.0   Platelets 150 - 400 K/uL 217  249     CMP:    Latest Ref Rng & Units 11/04/2016    7:12 PM 08/07/2016    4:22 PM  CMP  Glucose 65 - 99 mg/dL 81  100   BUN 6 - 20 mg/dL 8  11   Creatinine 0.61 - 1.24 mg/dL 0.87  0.94   Sodium 135 - 145 mmol/L 138  138   Potassium 3.5 - 5.1 mmol/L 3.8  4.6   Chloride 101 - 111 mmol/L 104  102   CO2 22 - 32 mmol/L 28  31   Calcium 8.9 - 10.3 mg/dL 9.5  9.8   Total Protein 6.5 - 8.1 g/dL 7.5  7.4   Total Bilirubin 0.3 - 1.2 mg/dL 0.6  0.8   Alkaline Phos 38 - 126 U/L 78  91   AST 15 - 41 U/L 20  19   ALT 17 - 63  U/L 13  22         Carmell Austria, MD 08/31/2022, 1:57 PM  Cc: Dwan Bolt, MD

## 2022-09-28 ENCOUNTER — Other Ambulatory Visit: Payer: Self-pay

## 2022-09-30 ENCOUNTER — Ambulatory Visit (AMBULATORY_SURGERY_CENTER): Payer: BC Managed Care – PPO | Admitting: Gastroenterology

## 2022-09-30 ENCOUNTER — Other Ambulatory Visit (INDEPENDENT_AMBULATORY_CARE_PROVIDER_SITE_OTHER): Payer: BC Managed Care – PPO

## 2022-09-30 ENCOUNTER — Encounter: Payer: Self-pay | Admitting: Gastroenterology

## 2022-09-30 VITALS — BP 109/58 | HR 85 | Temp 98.1°F | Resp 19 | Ht 67.0 in | Wt 129.0 lb

## 2022-09-30 DIAGNOSIS — K2970 Gastritis, unspecified, without bleeding: Secondary | ICD-10-CM

## 2022-09-30 DIAGNOSIS — R1084 Generalized abdominal pain: Secondary | ICD-10-CM

## 2022-09-30 LAB — CBC WITH DIFFERENTIAL/PLATELET
Basophils Absolute: 0.1 10*3/uL (ref 0.0–0.1)
Basophils Relative: 0.8 % (ref 0.0–3.0)
Eosinophils Absolute: 0.1 10*3/uL (ref 0.0–0.7)
Eosinophils Relative: 1.4 % (ref 0.0–5.0)
HCT: 49.1 % (ref 39.0–52.0)
Hemoglobin: 16.9 g/dL (ref 13.0–17.0)
Lymphocytes Relative: 17.9 % (ref 12.0–46.0)
Lymphs Abs: 1.3 10*3/uL (ref 0.7–4.0)
MCHC: 34.3 g/dL (ref 30.0–36.0)
MCV: 94.5 fl (ref 78.0–100.0)
Monocytes Absolute: 0.6 10*3/uL (ref 0.1–1.0)
Monocytes Relative: 8 % (ref 3.0–12.0)
Neutro Abs: 5.2 10*3/uL (ref 1.4–7.7)
Neutrophils Relative %: 71.9 % (ref 43.0–77.0)
Platelets: 167 10*3/uL (ref 150.0–400.0)
RBC: 5.2 Mil/uL (ref 4.22–5.81)
RDW: 13.6 % (ref 11.5–15.5)
WBC: 7.3 10*3/uL (ref 4.0–10.5)

## 2022-09-30 LAB — COMPREHENSIVE METABOLIC PANEL
ALT: 14 U/L (ref 0–53)
AST: 18 U/L (ref 0–37)
Albumin: 4.5 g/dL (ref 3.5–5.2)
Alkaline Phosphatase: 66 U/L (ref 39–117)
BUN: 18 mg/dL (ref 6–23)
CO2: 30 mEq/L (ref 19–32)
Calcium: 9.5 mg/dL (ref 8.4–10.5)
Chloride: 104 mEq/L (ref 96–112)
Creatinine, Ser: 0.89 mg/dL (ref 0.40–1.50)
GFR: 117.18 mL/min (ref >60.00)
Glucose, Bld: 81 mg/dL (ref 70–99)
Potassium: 4.7 mEq/L (ref 3.5–5.1)
Sodium: 140 mEq/L (ref 135–145)
Total Bilirubin: 1 mg/dL (ref 0.2–1.2)
Total Protein: 7 g/dL (ref 6.0–8.3)

## 2022-09-30 NOTE — Progress Notes (Signed)
Chief Complaint: For GI evaluation  Referring Provider:  [facility]      ASSESSMENT AND PLAN;   #1. SMA syndrome.  On equate nutritional shakes 12/day.  Maintaining weight. Nl Alb  #2. IBS-C   Plan: -Enteroscopy @ [facility]. Clear liq 24hr prior. -Omeprazole 20mg  po QD (capsules, open capsule-do not chew). -Avoid NSAIDs -CBC, CMP @ time of enteroscopy. -Cut down and stop marijuana (avoid complicating current clinical picture)   HPI:    Adrian Kirby is a 27 y.o. male  With Anxiety, GERD, H/O Substance abuse (smokes weed, quit ETOH 09/2019  With SMA syndrome on CT, confirmed with upper GI series Seen by Dr. Sophronia Simas Advised to get enteroscopy to rule out duodenal mucosal lesions prior to tube feeds/surgery.   He has been having postprandial nausea, regurgitation, heartburn x several years, worst since December 2023 Evaluated in ED 12/20, Nl CBC, CMP (alb 4.3), lipase. CT scan Abdo/pelvis showed SMA syndrome.  He has been seen by vascular surgery and Dr. Sophronia Simas.  Underwent upper GI series which confirmed partial obstruction in the third portion of duodenum.  He is currently on 12/day equate nutritional shakes and is maintaining his weight.  He has not eaten anything solid for the last 2 months.  He does have alternating diarrhea and constipation.  Continues to use marijuana.  He has quit all alcohol and other drugs since April 2021.  No history of IV drug use.    Wt Readings from Last 3 Encounters:  09/30/22 129 lb (58.5 kg)  08/31/22 129 lb (58.5 kg)  06/22/22 130 lb (59 kg)    Past GI workup: UGI series 07/29/2022  1. Findings consistent with partial obstruction at the level of the transverse duodenum, favoring SMA syndrome. 2. No other explanation for patient's symptoms. Upper normal small bowel caliber throughout the abdomen and pelvis, without focal transition point to suggest obstruction.  CT Abdo/pelvis with contrast 06/16/2022 1. No  acute abnormality identified in the abdomen or pelvis.  2. Focal dilation of the proximal duodenum measuring up to 3.8 cm  with abrupt narrowing posterior to the SMA and SMV measuring 4 mm in  diameter at this location, 2018 the proximal duodenal measured 2.5  cm with the duodenum posterior to the SMA/SMV measuring 3 mm.  Findings are nonspecific but can be seen in the setting of SMA  syndrome.  3. Moderate volume of formed stool in the colon.   Review of labs from 06/08/2022: Normal CBC.  Normal CMP with albumin 4.3, BUN/creatinine 14/0.82.  Normal lipase at 23 Past Medical History:  Diagnosis Date   Anxiety    Depression    GERD (gastroesophageal reflux disease)    PTSD (post-traumatic stress disorder)     History reviewed. No pertinent surgical history.  Family History  Problem Relation Age of Onset   Skin cancer Paternal Grandfather    Liver disease Neg Hx    Esophageal cancer Neg Hx    Colon cancer Neg Hx     Social History   Tobacco Use   Smoking status: Every Day    Packs/day: 1    Types: Cigarettes   Smokeless tobacco: Current    Types: Chew  Vaping Use   Vaping Use: Never used  Substance Use Topics   Alcohol use: Not Currently    Comment: "sometimes"   Drug use: Yes    Types: Marijuana    Current Outpatient Medications  Medication Sig Dispense Refill   omeprazole (PRILOSEC)  20 MG capsule Take 1 capsule (20 mg total) by mouth daily. 90 capsule 3   No current facility-administered medications for this visit.    No Known Allergies  Review of Systems:  Constitutional: Denies fever, chills, diaphoresis, appetite change and fatigue.  HEENT: Denies photophobia, eye pain, redness, hearing loss, ear pain, congestion, sore throat, rhinorrhea, sneezing, mouth sores, neck pain, neck stiffness and tinnitus.   Respiratory: Denies SOB, DOE, cough, chest tightness,  and wheezing.   Cardiovascular: Denies chest pain, palpitations and leg swelling.  Genitourinary:  Denies dysuria, urgency, frequency, hematuria, flank pain and difficulty urinating.  Musculoskeletal: Denies myalgias, back pain, joint swelling, arthralgias and gait problem.  Skin: No rash.  Neurological: Denies dizziness, seizures, syncope, weakness, light-headedness, numbness and headaches.  Hematological: Denies adenopathy. Easy bruising, personal or family bleeding history  Psychiatric/Behavioral: Has anxiety or depression     Physical Exam:    BP 129/78   Pulse 62   Temp 98.1 F (36.7 C)   Resp 11   Ht  (1.702 m)   Wt 129 lb (58.5 kg)   SpO2 99%   BMI 20.20 kg/m  Wt Readings from Last 3 Encounters:  09/30/22 129 lb (58.5 kg)  08/31/22 129 lb (58.5 kg)  06/22/22 130 lb (59 kg)   Constitutional: Thin built male., in no acute distress.  Anxious Psychiatric: Normal mood and affect. Behavior is normal. HEENT: Conjunctivae are normal. No scleral icterus.  Cardiovascular: Normal rate, regular rhythm. No edema Pulmonary/chest: Effort normal and breath sounds-bilateral decreased breath sounds.  No wheezing, rales or rhonchi. Abdominal: Soft, nondistended. Nontender. Bowel sounds active throughout. There are no masses palpable. No hepatomegaly. Rectal: Deferred Neurological: Alert and oriented to person place and time. Skin: Skin is warm and dry. No rashes noted.  Data Reviewed: I have personally reviewed following labs and imaging studies  CBC:    Latest Ref Rng & Units 11/04/2016    7:12 PM 08/07/2016    4:22 PM  CBC  WBC 4.0 - 10.5 K/uL 9.2  7.4   Hemoglobin 13.0 - 17.0 g/dL 16.1  09.6   Hematocrit 39.0 - 52.0 % 47.0  49.0   Platelets 150 - 400 K/uL 217  249     CMP:    Latest Ref Rng & Units 11/04/2016    7:12 PM 08/07/2016    4:22 PM  CMP  Glucose 65 - 99 mg/dL 81  045   BUN 6 - 20 mg/dL 8  11   Creatinine 4.09 - 1.24 mg/dL 8.11  9.14   Sodium 782 - 145 mmol/L 138  138   Potassium 3.5 - 5.1 mmol/L 3.8  4.6   Chloride 101 - 111 mmol/L 104  102   CO2 22  - 32 mmol/L 28  31   Calcium 8.9 - 10.3 mg/dL 9.5  9.8   Total Protein 6.5 - 8.1 g/dL 7.5  7.4   Total Bilirubin 0.3 - 1.2 mg/dL 0.6  0.8   Alkaline Phos 38 - 126 U/L 78  91   AST 15 - 41 U/L 20  19   ALT 17 - 63 U/L 13  22         Edman Circle, MD 09/30/2022, 11:30 AM  Cc: [facility]

## 2022-09-30 NOTE — Op Note (Signed)
[facility] Patient Name: Adrian Kirby Procedure Date: 09/30/2022 11:24 AM MRN: [id number] Endoscopist: Lynann Bologna , MD, [id number] Age: 27 Referring MD:  Date of Birth: 12/11/1995 Gender: Male Account #: [id number] Procedure:                Enteroscopy Indications:              SMA syndrome on CT, confirmed with upper GI series.                            For enteroscopy to rule out any mucosal lesions. Medicines:                Monitored Anesthesia Care Procedure:                Pre-Anesthesia Assessment:                           - Prior to the procedure, a History and Physical                            was performed, and patient medications and                            allergies were reviewed. The patient's tolerance of                            previous anesthesia was also reviewed. The risks                            and benefits of the procedure and the sedation                            options and risks were discussed with the patient.                            All questions were answered, and informed consent                            was obtained. Prior Anticoagulants: The patient has                            taken no anticoagulant or antiplatelet agents. ASA                            Grade Assessment: III - A patient with severe                            systemic disease. After reviewing the risks and                            benefits, the patient was deemed in satisfactory                            condition to undergo the procedure.  After obtaining informed consent, the endoscope was                            passed under direct vision. Throughout the                            procedure, the patient's blood pressure, pulse, and                            oxygen saturations were monitored continuously. The                            [id number]-[id number] Slim SN [id number] was introduced through                            the  mouth, and advanced to the proximal jejunum.                            The upper GI endoscopy was accomplished without                            difficulty. The patient tolerated the procedure                            well. Scope In: Scope Out: Findings:                 The examined esophagus was normal with well-defined                            Z-line.                           A small hiatal hernia was present.                           Stomach was J-shaped. No outlet obstruction. No                            retained food. Localized minimal inflammation                            characterized by erythema was found in the entire                            examined stomach. Biopsies were taken with a cold                            forceps for histology.                           The first, second portion, third portion of the                            duodenum and fourth portion of the duodenum were  normal. No masses or ischemia. It did distend well.                            Biopsies were taken with a cold forceps for                            histology.                           The examined jejunum was normal. Complications:            No immediate complications. However, he did have                            several episodes of retching intraprocedure and                            after. Estimated Blood Loss:     Estimated blood loss: none. Impression:               - Mild Gastritis. Biopsied.                           - Normal duodenum. No mucosal abnormalities. No                            masses.                           - Normal examined Px jejunum. Recommendation:           - Patient has a contact number available for                            emergencies. The signs and symptoms of potential                            delayed complications were discussed with the                            patient. Return to normal activities  tomorrow.                            Written discharge instructions were provided to the                            patient.                           - Resume previous diet.                           - Continue present medications.                           - Await pathology results.                           -  The findings and recommendations were discussed                            with the patient's family.                           - Check CBC, CMP today                           - FU with Dr Freida Busman.                           - Stop marijuana, smoking Lynann Bologna, MD 09/30/2022 12:03:15 PM This report has been signed electronically.

## 2022-09-30 NOTE — Progress Notes (Signed)
Called to room to assist during endoscopic procedure.  Patient ID and intended procedure confirmed with present staff. Received instructions for my participation in the procedure from the performing physician.  

## 2022-09-30 NOTE — Progress Notes (Signed)
Following induction and procedure start, patient coughing with desaturation into the mid-80s. Oropharynx suctioned, HOB elevated, O2 FiO2 increased. Patient kept in procedure room after procedure end, weaning down O2 until patient able to swallow and maintain spo2 in 90s. Oropharynx suctioned as needed. Transitioned to 3L [place] in PACU. VSS. Patient coughing up clear and slightly blood secretions, but nothing gastric noted during any suction. Patient mentating appropriately. Care resumed by RN.

## 2022-09-30 NOTE — Patient Instructions (Addendum)
-   Resume previous diet. - Continue present medications. - Await pathology results. - Check CBC, CMP today - FU with Dr Freida Busman. - Stop marijuana, smoking  YOU HAD AN ENDOSCOPIC PROCEDURE TODAY AT THE [facility]:   Refer to the procedure report that was given to you for any specific questions about what was found during the examination.  If the procedure report does not answer your questions, please call your gastroenterologist to clarify.  If you requested that your care partner not be given the details of your procedure findings, then the procedure report has been included in a sealed envelope for you to review at your convenience later.  YOU SHOULD EXPECT: Some feelings of bloating in the abdomen. Passage of more gas than usual.  Walking can help get rid of the air that was put into your GI tract during the procedure and reduce the bloating. If you had a lower endoscopy (such as a colonoscopy or flexible sigmoidoscopy) you may notice spotting of blood in your stool or on the toilet paper. If you underwent a bowel prep for your procedure, you may not have a normal bowel movement for a few days.  Please Note:  You might notice some irritation and congestion in your nose or some drainage.  This is from the oxygen used during your procedure.  There is no need for concern and it should clear up in a day or so.  SYMPTOMS TO REPORT IMMEDIATELY:  Following upper endoscopy (EGD)  Vomiting of blood or coffee ground material  New chest pain or pain under the shoulder blades  Painful or persistently difficult swallowing  New shortness of breath  Fever of 100F or higher  Black, tarry-looking stools  For urgent or emergent issues, a gastroenterologist can be reached at any hour by calling [phone]. Do not use MyChart messaging for urgent concerns.    DIET:  We do recommend a small meal at first, but then you may proceed to your regular diet.  Drink plenty of fluids but you should  avoid alcoholic beverages for 24 hours.  ACTIVITY:  You should plan to take it easy for the rest of today and you should NOT DRIVE or use heavy machinery until tomorrow (because of the sedation medicines used during the test).    FOLLOW UP: Our staff will call the number listed on your records the next business day following your procedure.  We will call around 7:15- 8:00 am to check on you and address any questions or concerns that you may have regarding the information given to you following your procedure. If we do not reach you, we will leave a message.     If any biopsies were taken you will be contacted by phone or by letter within the next 1-3 weeks.  Please call us at [phone] if you have not heard about the biopsies in 3 weeks.    SIGNATURES/CONFIDENTIALITY: You and/or your care partner have signed paperwork which will be entered into your electronic medical record.  These signatures attest to the fact that that the information above on your After Visit Summary has been reviewed and is understood.  Full responsibility of the confidentiality of this discharge information lies with you and/or your care-partner.

## 2022-10-03 ENCOUNTER — Telehealth: Payer: Self-pay | Admitting: *Deleted

## 2022-10-03 NOTE — Telephone Encounter (Signed)
Follow up call attempt.  LVM to call if any questions or concerns. 

## 2022-10-10 ENCOUNTER — Encounter: Payer: Self-pay | Admitting: Gastroenterology
